# Patient Record
Sex: Female | Born: 1975 | Race: Black or African American | Hispanic: No | Marital: Single | Smoking: Former smoker
Health system: Southern US, Community
[De-identification: ages and names within clinical notes are randomized; demographics above are authoritative.]

## PROBLEM LIST (undated history)

## (undated) DIAGNOSIS — Z789 Other specified health status: Secondary | ICD-10-CM

## (undated) DIAGNOSIS — K219 Gastro-esophageal reflux disease without esophagitis: Secondary | ICD-10-CM

## (undated) DIAGNOSIS — F419 Anxiety disorder, unspecified: Secondary | ICD-10-CM

## (undated) DIAGNOSIS — G4730 Sleep apnea, unspecified: Secondary | ICD-10-CM

## (undated) DIAGNOSIS — J45909 Unspecified asthma, uncomplicated: Secondary | ICD-10-CM

## (undated) DIAGNOSIS — D259 Leiomyoma of uterus, unspecified: Secondary | ICD-10-CM

## (undated) HISTORY — DX: Leiomyoma of uterus, unspecified: D25.9

## (undated) HISTORY — PX: NO PAST SURGERIES: SHX2092

## (undated) HISTORY — PX: WISDOM TOOTH EXTRACTION: SHX21

---

## 1997-07-27 ENCOUNTER — Ambulatory Visit (HOSPITAL_COMMUNITY): Admission: RE | Admit: 1997-07-27 | Discharge: 1997-07-27 | Payer: Self-pay | Admitting: *Deleted

## 1998-07-21 ENCOUNTER — Emergency Department (HOSPITAL_COMMUNITY): Admission: EM | Admit: 1998-07-21 | Discharge: 1998-07-21 | Payer: Self-pay | Admitting: Emergency Medicine

## 1998-07-25 ENCOUNTER — Encounter: Admission: RE | Admit: 1998-07-25 | Discharge: 1998-07-25 | Payer: Self-pay | Admitting: Family Medicine

## 1998-07-31 ENCOUNTER — Encounter: Admission: RE | Admit: 1998-07-31 | Discharge: 1998-07-31 | Payer: Self-pay | Admitting: Family Medicine

## 1998-08-29 ENCOUNTER — Other Ambulatory Visit: Admission: RE | Admit: 1998-08-29 | Discharge: 1998-08-29 | Payer: Self-pay | Admitting: *Deleted

## 1999-09-21 ENCOUNTER — Emergency Department (HOSPITAL_COMMUNITY): Admission: EM | Admit: 1999-09-21 | Discharge: 1999-09-21 | Payer: Self-pay | Admitting: Emergency Medicine

## 2000-01-31 ENCOUNTER — Encounter: Payer: Self-pay | Admitting: Emergency Medicine

## 2000-01-31 ENCOUNTER — Emergency Department (HOSPITAL_COMMUNITY): Admission: EM | Admit: 2000-01-31 | Discharge: 2000-01-31 | Payer: Self-pay | Admitting: Emergency Medicine

## 2000-07-04 ENCOUNTER — Emergency Department (HOSPITAL_COMMUNITY): Admission: EM | Admit: 2000-07-04 | Discharge: 2000-07-05 | Payer: Self-pay | Admitting: Emergency Medicine

## 2001-12-28 ENCOUNTER — Inpatient Hospital Stay (HOSPITAL_COMMUNITY): Admission: AD | Admit: 2001-12-28 | Discharge: 2001-12-28 | Payer: Self-pay | Admitting: *Deleted

## 2004-07-08 ENCOUNTER — Emergency Department (HOSPITAL_COMMUNITY): Admission: EM | Admit: 2004-07-08 | Discharge: 2004-07-08 | Payer: Self-pay | Admitting: Emergency Medicine

## 2005-11-26 ENCOUNTER — Emergency Department (HOSPITAL_COMMUNITY): Admission: EM | Admit: 2005-11-26 | Discharge: 2005-11-26 | Payer: Self-pay | Admitting: Family Medicine

## 2006-11-13 ENCOUNTER — Emergency Department (HOSPITAL_COMMUNITY): Admission: EM | Admit: 2006-11-13 | Discharge: 2006-11-13 | Payer: Self-pay | Admitting: Family Medicine

## 2010-03-08 ENCOUNTER — Encounter: Admission: RE | Admit: 2010-03-08 | Discharge: 2010-03-08 | Payer: Self-pay | Admitting: Obstetrics

## 2010-06-17 ENCOUNTER — Encounter: Payer: Self-pay | Admitting: Gastroenterology

## 2011-02-09 ENCOUNTER — Inpatient Hospital Stay (INDEPENDENT_AMBULATORY_CARE_PROVIDER_SITE_OTHER)
Admission: RE | Admit: 2011-02-09 | Discharge: 2011-02-09 | Disposition: A | Discharge status: Home / Self Care | Payer: BC Managed Care – PPO | Source: Ambulatory Visit | Attending: Family Medicine | Admitting: Family Medicine

## 2011-02-09 DIAGNOSIS — J40 Bronchitis, not specified as acute or chronic: Secondary | ICD-10-CM

## 2011-02-09 DIAGNOSIS — J069 Acute upper respiratory infection, unspecified: Secondary | ICD-10-CM

## 2011-03-13 LAB — POCT RAPID STREP A: Streptococcus, Group A Screen (Direct): NEGATIVE

## 2011-09-28 ENCOUNTER — Emergency Department (HOSPITAL_COMMUNITY)
Admission: EM | Admit: 2011-09-28 | Discharge: 2011-09-28 | Disposition: A | Discharge status: Home / Self Care | Payer: Self-pay | Source: Home / Self Care

## 2011-12-10 ENCOUNTER — Encounter (HOSPITAL_BASED_OUTPATIENT_CLINIC_OR_DEPARTMENT_OTHER): Payer: BC Managed Care – PPO

## 2012-01-17 ENCOUNTER — Telehealth (INDEPENDENT_AMBULATORY_CARE_PROVIDER_SITE_OTHER): Payer: Self-pay

## 2012-01-17 NOTE — Telephone Encounter (Signed)
Dr. Jean Rosenthal Moore's office unable to reach pt to schedule mgm.  Pt has appt w/ Dr. Donell Beers on 01/20/12.

## 2012-01-20 ENCOUNTER — Ambulatory Visit (INDEPENDENT_AMBULATORY_CARE_PROVIDER_SITE_OTHER): Payer: Managed Care, Other (non HMO) | Admitting: General Surgery

## 2012-01-29 ENCOUNTER — Encounter (INDEPENDENT_AMBULATORY_CARE_PROVIDER_SITE_OTHER): Payer: Self-pay | Admitting: General Surgery

## 2012-03-20 ENCOUNTER — Other Ambulatory Visit: Payer: Self-pay | Admitting: Obstetrics & Gynecology

## 2012-03-20 DIAGNOSIS — N6320 Unspecified lump in the left breast, unspecified quadrant: Secondary | ICD-10-CM

## 2012-03-25 ENCOUNTER — Ambulatory Visit
Admission: RE | Admit: 2012-03-25 | Discharge: 2012-03-25 | Disposition: A | Discharge status: Home / Self Care | Payer: Managed Care, Other (non HMO) | Source: Ambulatory Visit | Attending: Obstetrics & Gynecology | Admitting: Obstetrics & Gynecology

## 2012-03-25 DIAGNOSIS — N6320 Unspecified lump in the left breast, unspecified quadrant: Secondary | ICD-10-CM

## 2012-10-12 ENCOUNTER — Encounter (HOSPITAL_COMMUNITY): Payer: Self-pay | Admitting: *Deleted

## 2012-10-12 ENCOUNTER — Inpatient Hospital Stay (HOSPITAL_COMMUNITY)
Admission: AD | Admit: 2012-10-12 | Discharge: 2012-10-12 | Disposition: A | Discharge status: Home / Self Care | Payer: Managed Care, Other (non HMO) | Source: Ambulatory Visit | Attending: Obstetrics | Admitting: Obstetrics

## 2012-10-12 ENCOUNTER — Inpatient Hospital Stay (HOSPITAL_COMMUNITY): Payer: Managed Care, Other (non HMO)

## 2012-10-12 DIAGNOSIS — D259 Leiomyoma of uterus, unspecified: Secondary | ICD-10-CM

## 2012-10-12 DIAGNOSIS — N949 Unspecified condition associated with female genital organs and menstrual cycle: Secondary | ICD-10-CM | POA: Insufficient documentation

## 2012-10-12 DIAGNOSIS — N938 Other specified abnormal uterine and vaginal bleeding: Secondary | ICD-10-CM | POA: Insufficient documentation

## 2012-10-12 DIAGNOSIS — N921 Excessive and frequent menstruation with irregular cycle: Secondary | ICD-10-CM | POA: Insufficient documentation

## 2012-10-12 LAB — COMPREHENSIVE METABOLIC PANEL
AST: 16 U/L (ref 0–37)
BUN: 7 mg/dL (ref 6–23)
CO2: 25 mEq/L (ref 19–32)
Chloride: 101 mEq/L (ref 96–112)
Creatinine, Ser: 0.86 mg/dL (ref 0.50–1.10)
GFR calc Af Amer: >90 mL/min (ref >90)
GFR calc non Af Amer: 85 mL/min — ABNORMAL LOW (ref >90)
Glucose, Bld: 92 mg/dL (ref 70–99)
Total Bilirubin: 0.3 mg/dL (ref 0.3–1.2)

## 2012-10-12 LAB — CBC
HCT: 40.2 % (ref 36.0–46.0)
Hemoglobin: 14.1 g/dL (ref 12.0–15.0)
MCV: 95 fL (ref 78.0–100.0)
Platelets: 207 10*3/uL (ref 150–400)
RBC: 4.23 MIL/uL (ref 3.87–5.11)
WBC: 6.2 10*3/uL (ref 4.0–10.5)

## 2012-10-12 LAB — WET PREP, GENITAL: Yeast Wet Prep HPF POC: NONE SEEN

## 2012-10-12 MED ORDER — NORETHINDRONE-ETH ESTRADIOL 1-35 MG-MCG PO TABS: 1.0000 | ORAL_TABLET | Freq: Three times a day (TID) | ORAL | Status: DC

## 2012-10-12 NOTE — MAU Note (Signed)
Pt was sent to MAU from office.

## 2012-10-12 NOTE — MAU Note (Signed)
Started spotting on the 11th, last night flow became heavier, and today the flow was heavier with large clots.  Changing pads q1h since 1100 this morning.  Dr Clearance Coots sent her here to get blood drawn. Has nuva ring in place.

## 2012-10-12 NOTE — MAU Provider Note (Signed)
History     CSN: [id number]  Arrival date and time: 10/12/12 1438   None     Chief Complaint  Patient presents with  . Vaginal Bleeding   HPI  Kelly Bolton is a 37 y.o. [id number] who was sent from the office for vaginal bleeding. She states that she started spotting early last week, and last night it became very heavy. She states that she is changing a pad about once an hour. She did not have a pelvic exam done in the office. Pt requesting to have nuva ring removed during exam.   Past Medical History  Diagnosis Date  . Preterm delivery     Past Surgical History  Procedure Laterality Date  . Wisdom tooth extraction      History reviewed. No pertinent family history.  History  Substance Use Topics  . Smoking status: Former Smoker    Quit date: 10/12/2008  . Smokeless tobacco: Not on file  . Alcohol Use: No    Allergies: No Known Allergies  Prescriptions prior to admission  Medication Sig Dispense Refill  . amoxicillin (AMOXIL) 500 MG capsule Take 500 mg by mouth 3 (three) times daily.      . cetirizine (ZYRTEC) 10 MG tablet Take 10 mg by mouth daily as needed for allergies.      Marland Kitchen etonogestrel-ethinyl estradiol (NUVARING) 0.12-0.015 MG/24HR vaginal ring Place 1 each vaginally every 28 (twenty-eight) days. Insert vaginally and leave in place for 3 consecutive weeks, then remove for 1 week.      Marland Kitchen HYDROcodone-acetaminophen (NORCO) 7.5-325 MG per tablet Take 1 tablet by mouth every 4 (four) hours as needed for pain.        Review of Systems  Constitutional: Negative for fever.  Eyes: Negative for blurred vision.  Respiratory: Negative for shortness of breath.   Cardiovascular: Negative for chest pain.  Gastrointestinal: Positive for abdominal pain (cramps when she passes a large clot). Negative for nausea, vomiting and constipation.  Genitourinary: Negative for dysuria, urgency and frequency.  Musculoskeletal: Negative for myalgias.  Neurological: Negative for  dizziness and headaches.   Physical Exam   Blood pressure 134/78, pulse 75, resp. rate 16, height 5\' 8"  (1.727 m), weight 86.183 kg (190 lb), last menstrual period 10/04/2012, SpO2 100.00%.  Physical Exam  Nursing note and vitals reviewed. Constitutional: She is oriented to person, place, and time. She appears well-developed and well-nourished. No distress.  Cardiovascular: Normal rate.   Respiratory: Effort normal.  GI: Soft. There is no tenderness.  Genitourinary:   External: no lesion, bloody. Pad that she had been wearing for about 30 mins is soaked Vagina: large amount of blood and blood clots. Nuva ring removed per patient request Cervix: pink, smooth Uterus: NSSC Adnexa: NT  Neurological: She is alert and oriented to person, place, and time.  Skin: Skin is warm and dry.  Psychiatric: She has a normal mood and affect.    MAU Course  Procedures  Results for orders placed during the hospital encounter of 10/12/12 (from the past 24 hour(s))  CBC     Status: None   Collection Time    10/12/12  3:48 PM      Result Value Range   WBC 6.2  4.0 - 10.5 K/uL   RBC 4.23  3.87 - 5.11 MIL/uL   Hemoglobin 14.1  12.0 - 15.0 g/dL   HCT 14.7  82.9 - 56.2 %   MCV 95.0  78.0 - 100.0 fL   MCH 33.3  26.0 -  34.0 pg   MCHC 35.1  30.0 - 36.0 g/dL   RDW 21.3  08.6 - 57.8 %   Platelets 207  150 - 400 K/uL  COMPREHENSIVE METABOLIC PANEL     Status: Abnormal   Collection Time    10/12/12  3:48 PM      Result Value Range   Sodium 135  135 - 145 mEq/L   Potassium 3.5  3.5 - 5.1 mEq/L   Chloride 101  96 - 112 mEq/L   CO2 25  19 - 32 mEq/L   Glucose, Bld 92  70 - 99 mg/dL   BUN 7  6 - 23 mg/dL   Creatinine, Ser 4.69  0.50 - 1.10 mg/dL   Calcium 9.1  8.4 - 62.9 mg/dL   Total Protein 8.1  6.0 - 8.3 g/dL   Albumin 3.5  3.5 - 5.2 g/dL   AST 16  0 - 37 U/L   ALT 15  0 - 35 U/L   Alkaline Phosphatase 46  39 - 117 U/L   Total Bilirubin 0.3  0.3 - 1.2 mg/dL   GFR calc non Af Amer 85 (*) >90  mL/min   GFR calc Af Amer >90  >90 mL/min  HCG, QUANTITATIVE, PREGNANCY     Status: None   Collection Time    10/12/12  3:48 PM      Result Value Range   hCG, Beta Chain, Quant, S <1  <5 mIU/mL    RADIOLOGY REPORT*  Clinical Data: Menorrhagia  TRANSABDOMINAL AND TRANSVAGINAL ULTRASOUND OF PELVIS  Technique: Both transabdominal and transvaginal ultrasound  examinations of the pelvis were performed. Transabdominal  technique was performed for global imaging of the pelvis including  uterus, ovaries, adnexal regions, and pelvic cul-de-sac.  It was necessary to proceed with endovaginal exam following the  transabdominal exam to visualize the endometrium and ovaries.  Comparison: None.  Findings:  Uterus: 10.9 x 7.6 x 7.3 cm. Dominant left lateral uterine fundal  intramural / submucosal fibroid measures 4.9 x 4.7 x 4.4 cm,  displacing the endometrial stripe to the right.  Posterior intramural / subserosal uterine body fibroid measures 2.9  x 2.5 x 1.5 cm.  Endometrium: 8 mm. Uniformly echogenic, displaced to the right by  dominant uterine body fibroid.  Right ovary: 2.5 x 1.3 x 1.0 cm. Normal.  Left ovary: 1.9 x 1.6 x 0.9 cm. Normal.  Other Findings: No free fluid  IMPRESSION:  Uterine fibroids as above. One produces mass effect upon the  endometrial stripe and demonstrates a submucosal component that  could account for the history of menorrhagia.  Original Report Authenticated By: Christiana Pellant, M.D.  1630: C/W Dr. Clearance Coots, plan to have a pelvic ultrasound done. The do an OCP taper with a monophasic (ortho novum 135). Take 1 tab TID until the pack is done and then start a new pack. FU in the office in about 2 months. Will call Dr. Clearance Coots is anything abnormal on [place].  Assessment and Plan   1. Metrorrhagia   2. Fibroid, uterine    RX: ortho novum 1/35 1 TID x 7 DAYS the resume normal use of the pills FU in the office in 2 months unless bleeding increases.  Tawnya Crook 10/12/2012, 4:51 PM

## 2012-10-12 NOTE — MAU Note (Signed)
Pt states began spotting last week, then progressively became heavier. Has changed pad x4 since 11 am. Saw blood clot that filled up her pad. No pain at present. Only painful when passing clots

## 2012-10-13 LAB — GC/CHLAMYDIA PROBE AMP: CT Probe RNA: NEGATIVE

## 2013-02-03 ENCOUNTER — Other Ambulatory Visit: Payer: Self-pay

## 2013-04-06 ENCOUNTER — Encounter: Payer: Self-pay | Admitting: Obstetrics & Gynecology

## 2013-04-12 ENCOUNTER — Other Ambulatory Visit: Payer: Self-pay | Admitting: Obstetrics and Gynecology

## 2013-04-29 NOTE — Patient Instructions (Signed)
Your procedure is scheduled on: 05/07/2013  Enter through the Main Entrance of [facility] at: 0900AM  Pick up the phone at the desk and dial [phone].  Call this number if you have problems the morning of surgery: [phone].  Remember: Do NOT eat food: AFTER MIDNIGHT 05/06/2013  Do NOT drink clear liquids after: AFTER MIDNIGHT 05/06/2013  Take these medicines the morning of surgery with a SIP OF WATER: NONE  Do NOT wear jewelry (body piercing), make-up, or nail polish. Do NOT wear lotions, powders, or perfumes.  You may wear deoderant. Do NOT shave for 48 hours prior to surgery. Do NOT bring valuables to the hospital. Contacts, dentures, or bridgework may not be worn into surgery.  Have a responsible adult drive you home and stay with you for 24 hours after your procedure

## 2013-04-30 ENCOUNTER — Encounter (HOSPITAL_COMMUNITY): Payer: Self-pay | Admitting: Pharmacist

## 2013-04-30 ENCOUNTER — Inpatient Hospital Stay (HOSPITAL_COMMUNITY)
Admission: RE | Admit: 2013-04-30 | Discharge: 2013-04-30 | Disposition: A | Discharge status: Home / Self Care | Payer: Managed Care, Other (non HMO) | Source: Ambulatory Visit

## 2013-05-06 ENCOUNTER — Encounter (HOSPITAL_COMMUNITY): Payer: Self-pay

## 2013-05-06 ENCOUNTER — Encounter (HOSPITAL_COMMUNITY): Payer: Self-pay | Admitting: Obstetrics and Gynecology

## 2013-05-06 ENCOUNTER — Encounter (HOSPITAL_COMMUNITY)
Admission: RE | Admit: 2013-05-06 | Discharge: 2013-05-06 | Disposition: A | Discharge status: Home / Self Care | Payer: Managed Care, Other (non HMO) | Source: Ambulatory Visit | Attending: Obstetrics and Gynecology | Admitting: Obstetrics and Gynecology

## 2013-05-06 DIAGNOSIS — R768 Other specified abnormal immunological findings in serum: Secondary | ICD-10-CM | POA: Diagnosis not present

## 2013-05-06 DIAGNOSIS — N920 Excessive and frequent menstruation with regular cycle: Secondary | ICD-10-CM | POA: Diagnosis present

## 2013-05-06 DIAGNOSIS — N946 Dysmenorrhea, unspecified: Secondary | ICD-10-CM | POA: Diagnosis present

## 2013-05-06 DIAGNOSIS — Z01812 Encounter for preprocedural laboratory examination: Secondary | ICD-10-CM | POA: Insufficient documentation

## 2013-05-06 DIAGNOSIS — D259 Leiomyoma of uterus, unspecified: Secondary | ICD-10-CM | POA: Diagnosis present

## 2013-05-06 DIAGNOSIS — Z01818 Encounter for other preprocedural examination: Secondary | ICD-10-CM | POA: Insufficient documentation

## 2013-05-06 HISTORY — DX: Other specified health status: Z78.9

## 2013-05-06 NOTE — H&P (Signed)
Kelly Bolton is an 37 y.o. female. The patient presents for management of menorrhagia in the presence of known uterine fibroids. The patient gives a history of progressively heavy menses over the last year, lasting up to 7 days. She also gives a history of increasingly severe dysmenorrhea. She has a history of associated PMS symptoms. She underwent a workup of her menorrhagia, which included a sonohysterogram showing intramural, but no intracavitary uterine fibroids, and normal. Endometrial biopsy. The patient had decided that she wanted to undergo endometrial ablation.  She declined progesterone containing contraceptives that. Also had the potential of decreasing her menses, and possibly her dysmenorrhea, such as, Mirena or Depo-Provera or Nexplanon.  She expressed concern about possible side effect of hair loss. Pertinent Gynecological History: Menses: flow is excessive with use of 12-15 pads or tampons on heaviest days, irregular  without intermenstrual spotting and with severe dysmenorrhea Bleeding: History of oligomenorrhea and menorrhagia, with periods becoming progressively head. The over the last year Contraception: none and Patient states she is not interested in pregnancy. 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    Past Medical History  Diagnosis Date  . Preterm delivery   . Medical history non-contributory     Past Surgical History  Procedure Laterality Date  . Wisdom tooth extraction    . No past surgeries      History reviewed. No pertinent family history.  Social History:  reports that she quit smoking about 4 years ago. She does not have any smokeless tobacco history on file. She reports that she does  not drink alcohol or use illicit drugs.  Allergies: No Known Allergies  No prescriptions prior to admission    Review of Systems  Constitutional: Negative.   HENT: Negative.   Eyes: Negative.   Respiratory: Negative.   Cardiovascular: Negative.   Gastrointestinal: Negative.   Genitourinary: Negative.   Musculoskeletal: Negative.   Skin: Negative.   Neurological: Negative.   Endo/Heme/Allergies: Negative.   Psychiatric/Behavioral: The patient is nervous/anxious.     Height 5\' 8"  (1.727 m), weight 200 lb (90.719 kg). Physical Exam  Constitutional: She is oriented to person, place, and time. She appears well-developed and well-nourished.  HENT:  Head: Normocephalic and atraumatic.  Eyes: Conjunctivae and EOM are normal.  Neck: Normal range of motion. Neck supple.  Cardiovascular: Normal rate and regular rhythm.   Respiratory: Effort normal and breath sounds normal.  GI: Soft. Bowel sounds are normal.  Genitourinary: Vagina normal.  Musculoskeletal: Normal range of motion.  Neurological: She is alert and oriented to person, place, and time.  Skin: Skin is warm and dry.  Psychiatric: She has a normal mood and affect.  Pelvic exam: VULVA: normal appearing vulva with no masses, tenderness or lesions, VAGINA: normal appearing vagina with normal color and discharge, no lesions, CERVIX: normal appearing cervix without discharge or lesions, UTERUS: enlarged to 8 week's size, irregular, mobile, ADNEXA: normal adnexa in size, nontender and no masses.  No results found for this or any previous visit (from the past 24 hour(s)).  No results found.  Assessment Menorrhagia , poorly responsive to birth control pills and NuvaRing Dysmenorrhea Uterine fibroids History of oligomenorrhea Needs contraception  Plan:  Long discussion with pt re options for management of  menorrhagia in the presence of fibroids. Pt also wants no pregnancy, but is not using birth control. Reinterated that  effective BC was needed afar endometrial ablation, and that a 85% rate of improved menses with about half those women experiencing amenorrhea. No promises could be made about the effects on cramps or PMS sx. Pt expressed disappointment that real guarantee could be made wrt her accompanying sx and wants to consider whether she wishes to proceed with endometriaL ablation.Risks and benefits of the procedure were reviewed.     HAYGOOD,VANESSA P 05/06/2013, 2:46 PM

## 2013-05-06 NOTE — Pre-Procedure Instructions (Signed)
Pt checked in with Radiology/Lab instead of main lobby on arrival for PAT. Remained there for over an hour. Pre-op interview completed, but patient stated to [facility] (lab phlebotomist) that she could not stay for lab draw and would come back later.

## 2013-05-06 NOTE — Patient Instructions (Signed)
19 Kelly Bolton  05/06/2013   Your procedure is scheduled on:  05/07/13  Enter through the Main Entrance of [facility] at 9 AM.  Pick up the phone at the desk and dial [phone].   Call this number if you have problems the morning of surgery: [phone]   Remember:   Do not eat food:After Midnight.  Do not drink clear liquids: After Midnight.  Take these medicines the morning of surgery with A SIP OF WATER: NA   Do not wear jewelry, make-up or nail polish.  Do not wear lotions, powders, or perfumes. You may wear deodorant.  Do not shave 48 hours prior to surgery.  Do not bring valuables to the hospital.  [facility] is not   responsible for any belongings or valuables brought to the hospital.  Contacts, dentures or bridgework may not be worn into surgery.  Leave suitcase in the car. After surgery it may be brought to your room.  For patients admitted to the hospital, checkout time is 11:00 AM the day of              discharge.   Patients discharged the day of surgery will not be allowed to drive             home.  Name and phone number of your driver: Adaline Sill  [phone]  Special Instructions:   Shower using CHG 2 nights before surgery and the night before surgery.  If you shower the day of surgery use CHG.  Use special wash - you have one bottle of CHG for all showers.  You should use approximately 1/3 of the bottle for each shower.   Please read over the following fact sheets that you were given:   Surgical Site Infection Prevention

## 2013-05-07 ENCOUNTER — Ambulatory Visit (HOSPITAL_COMMUNITY)
Admission: RE | Admit: 2013-05-07 | Payer: Managed Care, Other (non HMO) | Source: Ambulatory Visit | Admitting: Obstetrics and Gynecology

## 2013-05-07 ENCOUNTER — Encounter (HOSPITAL_COMMUNITY): Admission: RE | Payer: Self-pay | Source: Ambulatory Visit

## 2013-05-07 SURGERY — NOVASURE ABLATION
Anesthesia: Choice

## 2014-02-03 ENCOUNTER — Encounter (HOSPITAL_BASED_OUTPATIENT_CLINIC_OR_DEPARTMENT_OTHER): Payer: Self-pay | Admitting: Emergency Medicine

## 2014-02-03 ENCOUNTER — Emergency Department (HOSPITAL_BASED_OUTPATIENT_CLINIC_OR_DEPARTMENT_OTHER)
Admission: EM | Admit: 2014-02-03 | Discharge: 2014-02-03 | Disposition: A | Discharge status: Home / Self Care | Payer: BC Managed Care – PPO | Attending: Emergency Medicine | Admitting: Emergency Medicine

## 2014-02-03 DIAGNOSIS — M545 Low back pain, unspecified: Secondary | ICD-10-CM | POA: Diagnosis not present

## 2014-02-03 DIAGNOSIS — G8929 Other chronic pain: Secondary | ICD-10-CM | POA: Diagnosis not present

## 2014-02-03 DIAGNOSIS — Z3202 Encounter for pregnancy test, result negative: Secondary | ICD-10-CM | POA: Diagnosis not present

## 2014-02-03 DIAGNOSIS — Z87891 Personal history of nicotine dependence: Secondary | ICD-10-CM | POA: Insufficient documentation

## 2014-02-03 DIAGNOSIS — M5441 Lumbago with sciatica, right side: Secondary | ICD-10-CM

## 2014-02-03 LAB — URINALYSIS, ROUTINE W REFLEX MICROSCOPIC
BILIRUBIN URINE: NEGATIVE
GLUCOSE, UA: NEGATIVE mg/dL
Hgb urine dipstick: NEGATIVE
KETONES UR: NEGATIVE mg/dL
LEUKOCYTES UA: NEGATIVE
Nitrite: NEGATIVE
PH: 6.5 (ref 5.0–8.0)
PROTEIN: NEGATIVE mg/dL
Specific Gravity, Urine: 1.026 (ref 1.005–1.030)
Urobilinogen, UA: 1 mg/dL (ref 0.0–1.0)

## 2014-02-03 LAB — PREGNANCY, URINE: Preg Test, Ur: NEGATIVE

## 2014-02-03 MED ORDER — HYDROCODONE-ACETAMINOPHEN 5-325 MG PO TABS: 1.0000 | ORAL_TABLET | Freq: Four times a day (QID) | ORAL | Status: DC | PRN

## 2014-02-03 MED ORDER — ONDANSETRON HCL 4 MG/2ML IJ SOLN: 4.0000 mg | Freq: Once | INTRAMUSCULAR | Status: DC

## 2014-02-03 MED ORDER — MORPHINE SULFATE 4 MG/ML IJ SOLN: 4.0000 mg | Freq: Once | INTRAMUSCULAR | Status: DC

## 2014-02-03 MED ORDER — IBUPROFEN 800 MG PO TABS: 800.0000 mg | ORAL_TABLET | Freq: Three times a day (TID) | ORAL | Status: DC

## 2014-02-03 NOTE — ED Provider Notes (Signed)
CSN: [id number]     Arrival date & time 02/03/14  1806 History   First MD Initiated Contact with Patient 02/03/14 1900     Chief Complaint  Patient presents with  . Back Pain     (Consider location/radiation/quality/duration/timing/severity/associated sxs/prior Treatment) HPI Patient is a 38 year old female with past medical history of chronic back pain who presents to the ED with 2 weeks of back pain. Patient states her back pain has not been present for "years" and after doing cross fit approximately 2 weeks ago she noted a gradual onset of back pain the next morning. Patient states her pain is in her right lower back, radiates to the back of her leg and down to her ankle. Patient states the pain is made worse with movement, walking, sitting, and is relieved by lying right lateral recumbent. Patient states she has not taken anything for her pain. Patient denies any associated weakness, fever, nausea, vomiting, saddle anesthesia, bowel/bladder incontinence/retention.. Patient also denies history of cancer, IV drug use, or direct trauma to the area.  Past Medical History  Diagnosis Date  . Preterm delivery   . Medical history non-contributory    Past Surgical History  Procedure Laterality Date  . Wisdom tooth extraction    . No past surgeries     No family history on file. History  Substance Use Topics  . Smoking status: Former Smoker    Quit date: 10/12/2008  . Smokeless tobacco: Not on file  . Alcohol Use: No   OB History   Grav Para Term Preterm Abortions TAB SAB Ect Mult Living   2 1  1 1 1    1      Review of Systems  Constitutional: Negative for fever, chills and fatigue.  HENT: Negative for trouble swallowing.   Eyes: Negative for visual disturbance.  Respiratory: Negative for shortness of breath.   Cardiovascular: Negative for chest pain.  Gastrointestinal: Negative for nausea, vomiting and abdominal pain.  Genitourinary: Negative for dysuria.  Musculoskeletal:  Positive for back pain. Negative for neck pain and neck stiffness.  Skin: Negative for rash.  Neurological: Negative for dizziness, syncope, speech difficulty, weakness, numbness and headaches.  Psychiatric/Behavioral: Negative.       Allergies  Review of patient's allergies indicates no known allergies.  Home Medications   Prior to Admission medications   Medication Sig Start Date End Date Taking? Authorizing Provider  HYDROcodone-acetaminophen (NORCO/VICODIN) 5-325 MG per tablet Take 1-2 tablets by mouth every 6 (six) hours as needed. 02/03/14   Carrie Mew, PA-C  ibuprofen (ADVIL,MOTRIN) 800 MG tablet Take 1 tablet (800 mg total) by mouth 3 (three) times daily. 02/03/14   Carrie Mew, PA-C   BP 112/62  Pulse 88  Temp(Src) 98.2 F (36.8 C) (Oral)  Resp 18  Ht 5\' 10"  (1.778 m)  Wt 194 lb (87.998 kg)  BMI 27.84 kg/m2  SpO2 100%  LMP 01/27/2014 Physical Exam  Nursing note and vitals reviewed. Constitutional: She is oriented to person, place, and time. She appears well-developed and well-nourished. No distress.  HENT:  Head: Normocephalic and atraumatic.  Mouth/Throat: Oropharynx is clear and moist. No oropharyngeal exudate.  Eyes: EOM are normal. Pupils are equal, round, and reactive to light. Right eye exhibits no discharge. Left eye exhibits no discharge. No scleral icterus.  Neck: Normal range of motion.  Cardiovascular: Normal rate, regular rhythm and normal heart sounds.   No murmur heard. Pulmonary/Chest: Effort normal and breath sounds normal. No respiratory distress.  Abdominal:  Soft. There is no tenderness.  Musculoskeletal: Normal range of motion. She exhibits no edema and no tenderness.  Mild tenderness noted to palpation of patient's right SI joint. Also mild tenderness noted to the L4-S1 region.  Neurological: She is alert and oriented to person, place, and time. She has normal strength. No cranial nerve deficit or sensory deficit. She displays a negative  Romberg sign. Coordination and gait normal. GCS eye subscore is 4. GCS verbal subscore is 5. GCS motor subscore is 6.  Reflex Scores:      Patellar reflexes are 2+ on the right side and 2+ on the left side.      Achilles reflexes are 2+ on the right side and 2+ on the left side. Positive straight leg raise on the right. Negative straight leg raise on the left. Motor strength 5 out of 5 in all major muscle groups of lower extremities bilaterally.  Skin: Skin is warm and dry. No rash noted. She is not diaphoretic.  Psychiatric: She has a normal mood and affect.    ED Course  Procedures (including critical care time) Labs Review Labs Reviewed  URINALYSIS, ROUTINE W REFLEX MICROSCOPIC  PREGNANCY, URINE    Imaging Review No results found.   EKG Interpretation None      MDM   Final diagnoses:  Right-sided low back pain with right-sided sciatica     Gradual onset lower back pain x2 weeks. Patient noticed pain after waking up the next day after doing cross fit. Pain is alleviated by lying in the fetal position on her right side. Pain made worse with walking, sitting. Pain lower right lumbar region with radiation down the back of her right leg to her right ankle. History and exam consistent with lower back pain with radiculopathy. Neuro exam benign no neuro deficits, no history of cancer, IV drug use no fever, no chills, no nausea, vomiting. We will prescribe pain meds and have patient follow up with PCP.  Patient with back pain.  No neurological deficits and normal neuro exam.  Patient can walk but states is painful.  No loss of bowel or bladder control.  No concern for cauda equina.  No fever, night sweats, weight loss, h/o cancer, IVDU.  RICE protocol and pain medicine indicated and discussed with patient. Patient is agreeable to this plan. We encouraged patient to call or return to the ER should her symptoms change, worsen or should she have any questions or concerns.  BP 112/62   Pulse 88  Temp(Src) 98.2 F (36.8 C) (Oral)  Resp 18  Ht 5\' 10"  (1.778 m)  Wt 194 lb (87.998 kg)  BMI 27.84 kg/m2  SpO2 100%  LMP 01/27/2014   Signed,  Dahlia Bailiff, PA-C 12:35 AM    Carrie Mew, PA-C 02/04/14 0036

## 2014-02-03 NOTE — Discharge Instructions (Signed)
Use Motrin 800 mg up to 3 times a day. Use hydrocodone 1-2 pills every 4-6 hours as needed for pain. Followup with primary care physician. Return to the ER if symptoms worsen.  Back Pain:  Your back pain should be treated with medicines such as ibuprofen or aleve and this back pain should get better over the next 2 weeks.  However if you develop severe or worsening pain, low back pain with fever, numbness, weakness or inability to walk or urinate, you should return to the ER immediately.  Please follow up with your doctor this week for a recheck if still having symptoms. Low back pain is discomfort in the lower back that may be due to injuries to muscles and ligaments around the spine.  Occasionally, it may be caused by a a problem to a part of the spine called a disc.  The pain may last several days or a week;  However, most patients get completely well in 4 weeks.  Self - care:  The application of heat can help soothe the pain.  Maintaining your daily activities, including walking, is encourged, as it will help you get better faster than just staying in bed.  Medications are also useful to help with pain control.  A commonly prescribed medications includes acetaminophen.  This medication is generally safe, though you should not take more than 8 of the extra strength (500mg ) pills a day.  Non steroidal anti inflammatory medications including Ibuprofen and naproxen;  These medications help both pain and swelling and are very useful in treating back pain.  They should be taken with food, as they can cause stomach upset, and more seriously, stomach bleeding.    Muscle relaxants:  These medications can help with muscle tightness that is a cause of lower back pain.  Most of these medications can cause drowsiness, and it is not safe to drive or use dangerous machinery while taking them.  You will need to follow up with  Your primary healthcare provider in 1-2 weeks for reassessment.  Be aware that if you  develop new symptoms, such as a fever, leg weakness, difficulty with or loss of control of your urine or bowels, abdominal pain, or more severe pain, you will need to seek medical attention and  / or return to the Emergency department.  If you do not have a doctor see the list below.    Sciatica Sciatica is pain, weakness, numbness, or tingling along the path of the sciatic nerve. The nerve starts in the lower back and runs down the back of each leg. The nerve controls the muscles in the lower leg and in the back of the knee, while also providing sensation to the back of the thigh, lower leg, and the sole of your foot. Sciatica is a symptom of another medical condition. For instance, nerve damage or certain conditions, such as a herniated disk or bone spur on the spine, pinch or put pressure on the sciatic nerve. This causes the pain, weakness, or other sensations normally associated with sciatica. Generally, sciatica only affects one side of the body. CAUSES   Herniated or slipped disc.  Degenerative disk disease.  A pain disorder involving the narrow muscle in the buttocks (piriformis syndrome).  Pelvic injury or fracture.  Pregnancy.  Tumor (rare). SYMPTOMS  Symptoms can vary from mild to very severe. The symptoms usually travel from the low back to the buttocks and down the back of the leg. Symptoms can include:  Mild tingling or  dull aches in the lower back, leg, or hip.  Numbness in the back of the calf or sole of the foot.  Burning sensations in the lower back, leg, or hip.  Sharp pains in the lower back, leg, or hip.  Leg weakness.  Severe back pain inhibiting movement. These symptoms may get worse with coughing, sneezing, laughing, or prolonged sitting or standing. Also, being overweight may worsen symptoms. DIAGNOSIS  Your caregiver will perform a physical exam to look for common symptoms of sciatica. He or she may ask you to do certain movements or activities that  would trigger sciatic nerve pain. Other tests may be performed to find the cause of the sciatica. These may include:  Blood tests.  X-rays.  Imaging tests, such as an MRI or CT scan. TREATMENT  Treatment is directed at the cause of the sciatic pain. Sometimes, treatment is not necessary and the pain and discomfort goes away on its own. If treatment is needed, your caregiver may suggest:  Over-the-counter medicines to relieve pain.  Prescription medicines, such as anti-inflammatory medicine, muscle relaxants, or narcotics.  Applying heat or ice to the painful area.  Steroid injections to lessen pain, irritation, and inflammation around the nerve.  Reducing activity during periods of pain.  Exercising and stretching to strengthen your abdomen and improve flexibility of your spine. Your caregiver may suggest losing weight if the extra weight makes the back pain worse.  Physical therapy.  Surgery to eliminate what is pressing or pinching the nerve, such as a bone spur or part of a herniated disk. HOME CARE INSTRUCTIONS   Only take over-the-counter or prescription medicines for pain or discomfort as directed by your caregiver.  Apply ice to the affected area for 20 minutes, 3-4 times a day for the first 48-72 hours. Then try heat in the same way.  Exercise, stretch, or perform your usual activities if these do not aggravate your pain.  Attend physical therapy sessions as directed by your caregiver.  Keep all follow-up appointments as directed by your caregiver.  Do not wear high heels or shoes that do not provide proper support.  Check your mattress to see if it is too soft. A firm mattress may lessen your pain and discomfort. SEEK IMMEDIATE MEDICAL CARE IF:   You lose control of your bowel or bladder (incontinence).  You have increasing weakness in the lower back, pelvis, buttocks, or legs.  You have redness or swelling of your back.  You have a burning sensation when you  urinate.  You have pain that gets worse when you lie down or awakens you at night.  Your pain is worse than you have experienced in the past.  Your pain is lasting longer than 4 weeks.  You are suddenly losing weight without reason. MAKE SURE YOU:  Understand these instructions.  Will watch your condition.  Will get help right away if you are not doing well or get worse. Document Released: 05/07/2001 Document Revised: 11/12/2011 Document Reviewed: 09/22/2011 [facility] Patient Information 2015 [place], [place]. This information is not intended to replace advice given to you by your health care provider. Make sure you discuss any questions you have with your health care provider.

## 2014-02-03 NOTE — ED Notes (Signed)
All meds on Pt. Were discontinued.

## 2014-02-03 NOTE — ED Notes (Signed)
Chronic back pain. Pain today is worse than normal.

## 2014-02-03 NOTE — ED Notes (Signed)
Pt. Reports she drove to the ED today.

## 2014-02-04 NOTE — ED Provider Notes (Signed)
Medical screening examination/treatment/procedure(s) were performed by non-physician practitioner and as supervising physician I was immediately available for consultation/collaboration.   EKG Interpretation None        Evelina Bucy, MD 02/04/14 2324

## 2014-03-28 ENCOUNTER — Encounter (HOSPITAL_BASED_OUTPATIENT_CLINIC_OR_DEPARTMENT_OTHER): Payer: Self-pay | Admitting: Emergency Medicine

## 2018-03-27 DIAGNOSIS — Z1231 Encounter for screening mammogram for malignant neoplasm of breast: Secondary | ICD-10-CM | POA: Diagnosis not present

## 2018-03-27 DIAGNOSIS — Z1239 Encounter for other screening for malignant neoplasm of breast: Secondary | ICD-10-CM | POA: Diagnosis not present

## 2018-08-24 ENCOUNTER — Other Ambulatory Visit: Payer: Self-pay

## 2018-08-24 ENCOUNTER — Emergency Department (HOSPITAL_BASED_OUTPATIENT_CLINIC_OR_DEPARTMENT_OTHER)
Admission: EM | Admit: 2018-08-24 | Discharge: 2018-08-24 | Disposition: A | Discharge status: Home / Self Care | Payer: BLUE CROSS/BLUE SHIELD | Attending: Emergency Medicine | Admitting: Emergency Medicine

## 2018-08-24 ENCOUNTER — Encounter (HOSPITAL_BASED_OUTPATIENT_CLINIC_OR_DEPARTMENT_OTHER): Payer: Self-pay

## 2018-08-24 DIAGNOSIS — Z87891 Personal history of nicotine dependence: Secondary | ICD-10-CM | POA: Diagnosis not present

## 2018-08-24 DIAGNOSIS — W269XXA Contact with unspecified sharp object(s), initial encounter: Secondary | ICD-10-CM | POA: Diagnosis not present

## 2018-08-24 DIAGNOSIS — S61011A Laceration without foreign body of right thumb without damage to nail, initial encounter: Secondary | ICD-10-CM

## 2018-08-24 DIAGNOSIS — Y999 Unspecified external cause status: Secondary | ICD-10-CM | POA: Insufficient documentation

## 2018-08-24 DIAGNOSIS — Y929 Unspecified place or not applicable: Secondary | ICD-10-CM | POA: Insufficient documentation

## 2018-08-24 DIAGNOSIS — Y93G1 Activity, food preparation and clean up: Secondary | ICD-10-CM | POA: Diagnosis not present

## 2018-08-24 NOTE — ED Notes (Signed)
Lac to rt thumb,  Glass broke while washing dishes,,  Bleeding controlled

## 2018-08-24 NOTE — ED Triage Notes (Signed)
Pt states she cut her right thumb on broken glass 45 min PTA-lac noted-no bleeding-NAD-steady gait

## 2018-08-25 NOTE — ED Provider Notes (Signed)
Emergency Department Provider Note   I have reviewed the triage vital signs and the nursing notes.   HISTORY  Chief Complaint Laceration   HPI Kelly Bolton is a 43 y.o. female who presents with a small laceration to the right thumb while cleaning dishes.  Tetanus is up-to-date.  No injuries elsewhere.  No deficit in the thumb. No other associated or modifying symptoms.    Past Medical History:  Diagnosis Date  . Medical history non-contributory   . Preterm delivery     Patient Active Problem List   Diagnosis Date Noted  . Menorrhagia 05/06/2013  . Fibroid, uterine 05/06/2013  . Dysmenorrhea 05/06/2013  . 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 NO PAST SURGERIES    . WISDOM TOOTH EXTRACTION      Current Outpatient Rx  . Order #: [id number] Class: Print  . Order #: [id number] Class: Print    Allergies Patient has no known allergies.  No family history on file.  Social History Social History   Tobacco Use  . Smoking status: Former Smoker    Last attempt to quit: 10/12/2008    Years since quitting: 9.8  . Smokeless tobacco: Never Used  Substance Use Topics  . Alcohol use: Yes    Comment: occ  . Drug use: No    Review of Systems  All other systems negative except as documented in the HPI. All pertinent positives and negatives as reviewed in the HPI. ____________________________________________   PHYSICAL EXAM:  VITAL SIGNS: ED Triage Vitals  Enc Vitals Group     BP 08/24/18 1229 115/77     Pulse Rate 08/24/18 1229 68     Resp 08/24/18 1229 18     Temp 08/24/18 1229 98 F (36.7 C)     Temp Source 08/24/18 1229 Oral     SpO2 08/24/18 1229 99 %     Weight 08/24/18 1228 200 lb (90.7 kg)     Height 08/24/18 1228 5\' 7"  (1.702 m)     Head Circumference --      Peak Flow --      Pain Score 08/24/18 1227 0     Pain Loc --      Pain Edu? --      Excl. in [place]? --     Constitutional: Alert and  oriented. Well appearing and in no acute distress. Eyes: Conjunctivae are normal. PERRL. EOMI. Head: Atraumatic. Nose: No congestion/rhinnorhea. Mouth/Throat: Mucous membranes are moist.  Oropharynx non-erythematous. Neck: No stridor.  No meningeal signs.   Cardiovascular: Normal rate, regular rhythm. Good peripheral circulation. Grossly normal heart sounds.   Respiratory: Normal respiratory effort.  No retractions. Lungs CTAB. Gastrointestinal: Soft and nontender. No distention.  Musculoskeletal: No lower extremity tenderness nor edema. No gross deformities of extremities. Full extension and strength. Intact sensation to distal thumb. Intact capillary refill. Neurologic:  Normal speech and language. No gross focal neurologic deficits are appreciated.  Skin:  Skin is warm, dry and with 1 cm shallow laceration to dorsal right thumb. No rash noted.   ____________________________________________   PROCEDURES  Procedure(s) performed:   Marland KitchenMarland KitchenLaceration Repair Date/Time: 08/25/2018 7:04 AM Performed by: Merrily Pew, MD Authorized by: Merrily Pew, MD   Consent:    Consent obtained:  Verbal   Consent given by:  Patient   Risks discussed:  Infection, need for additional repair, nerve damage, poor wound healing, poor cosmetic result, pain, retained foreign body, tendon damage and vascular damage  Alternatives discussed:  No treatment, delayed treatment and observation Anesthesia (see MAR for exact dosages):    Anesthesia method:  Local infiltration   Local anesthetic:  Lidocaine 1% w/o epi Laceration details:    Location:  Finger   Finger location:  R thumb   Length (cm):  1   Depth (mm):  2 Repair type:    Repair type:  Simple Pre-procedure details:    Preparation:  Patient was prepped and draped in usual sterile fashion and imaging obtained to evaluate for foreign bodies Exploration:    Wound exploration: wound explored through full range of motion and entire depth of wound probed  and visualized   Treatment:    Area cleansed with:  Saline   Amount of cleaning:  Extensive   Irrigation solution:  Sterile water   Irrigation method:  Syringe Approximation:    Approximation:  Close Post-procedure details:    Patient tolerance of procedure:  Tolerated well, no immediate complications   ____________________________________________   INITIAL IMPRESSION / ASSESSMENT AND PLAN / ED COURSE  Laceration to right thumb. dermaclick applied with good approximation. Splint applied to thumb. tdap already UTD.    Pertinent labs & imaging results that were available during my care of the patient were reviewed by me and considered in my medical decision making (see chart for details).  ____________________________________________  FINAL CLINICAL IMPRESSION(S) / ED DIAGNOSES  Final diagnoses:  Laceration of right thumb without foreign body without damage to nail, initial encounter     MEDICATIONS GIVEN DURING THIS VISIT:  Medications - No data to display   NEW OUTPATIENT MEDICATIONS STARTED DURING THIS VISIT:  Discharge Medication List as of 08/24/2018  1:05 PM      Note:  This note was prepared with assistance of Dragon voice recognition software. Occasional wrong-word or sound-a-like substitutions may have occurred due to the inherent limitations of voice recognition software.   Jaydn Moscato, Corene Cornea, MD 08/25/18 [phone]

## 2018-11-20 DIAGNOSIS — Z20828 Contact with and (suspected) exposure to other viral communicable diseases: Secondary | ICD-10-CM | POA: Diagnosis not present

## 2018-11-25 DIAGNOSIS — Z8616 Personal history of COVID-19: Secondary | ICD-10-CM

## 2018-11-25 HISTORY — DX: Personal history of COVID-19: Z86.16

## 2018-12-03 DIAGNOSIS — B349 Viral infection, unspecified: Secondary | ICD-10-CM | POA: Diagnosis not present

## 2018-12-03 DIAGNOSIS — Z20828 Contact with and (suspected) exposure to other viral communicable diseases: Secondary | ICD-10-CM | POA: Diagnosis not present

## 2018-12-03 DIAGNOSIS — R509 Fever, unspecified: Secondary | ICD-10-CM | POA: Diagnosis not present

## 2018-12-06 ENCOUNTER — Emergency Department (HOSPITAL_BASED_OUTPATIENT_CLINIC_OR_DEPARTMENT_OTHER): Payer: BC Managed Care – PPO

## 2018-12-06 ENCOUNTER — Encounter (HOSPITAL_BASED_OUTPATIENT_CLINIC_OR_DEPARTMENT_OTHER): Payer: Self-pay | Admitting: *Deleted

## 2018-12-06 ENCOUNTER — Other Ambulatory Visit: Payer: Self-pay

## 2018-12-06 ENCOUNTER — Emergency Department (HOSPITAL_BASED_OUTPATIENT_CLINIC_OR_DEPARTMENT_OTHER)
Admission: EM | Admit: 2018-12-06 | Discharge: 2018-12-06 | Disposition: A | Discharge status: Home / Self Care | Payer: BC Managed Care – PPO | Attending: Emergency Medicine | Admitting: Emergency Medicine

## 2018-12-06 DIAGNOSIS — D259 Leiomyoma of uterus, unspecified: Secondary | ICD-10-CM | POA: Diagnosis not present

## 2018-12-06 DIAGNOSIS — D219 Benign neoplasm of connective and other soft tissue, unspecified: Secondary | ICD-10-CM

## 2018-12-06 DIAGNOSIS — Z87891 Personal history of nicotine dependence: Secondary | ICD-10-CM | POA: Insufficient documentation

## 2018-12-06 DIAGNOSIS — R05 Cough: Secondary | ICD-10-CM | POA: Diagnosis not present

## 2018-12-06 DIAGNOSIS — N3289 Other specified disorders of bladder: Secondary | ICD-10-CM | POA: Diagnosis not present

## 2018-12-06 DIAGNOSIS — U071 COVID-19: Secondary | ICD-10-CM | POA: Diagnosis not present

## 2018-12-06 DIAGNOSIS — R509 Fever, unspecified: Secondary | ICD-10-CM | POA: Diagnosis present

## 2018-12-06 DIAGNOSIS — R140 Abdominal distension (gaseous): Secondary | ICD-10-CM | POA: Diagnosis not present

## 2018-12-06 DIAGNOSIS — N838 Other noninflammatory disorders of ovary, fallopian tube and broad ligament: Secondary | ICD-10-CM | POA: Diagnosis not present

## 2018-12-06 LAB — URINALYSIS, ROUTINE W REFLEX MICROSCOPIC
Bilirubin Urine: NEGATIVE
Glucose, UA: NEGATIVE mg/dL
Ketones, ur: NEGATIVE mg/dL
Leukocytes,Ua: NEGATIVE
Nitrite: NEGATIVE
Protein, ur: NEGATIVE mg/dL
Specific Gravity, Urine: <1.005 — ABNORMAL LOW (ref 1.005–1.030)
pH: 7 (ref 5.0–8.0)

## 2018-12-06 LAB — CBC WITH DIFFERENTIAL/PLATELET
Abs Immature Granulocytes: 0 10*3/uL (ref 0.00–0.07)
Basophils Absolute: 0 10*3/uL (ref 0.0–0.1)
Basophils Relative: 0 %
Eosinophils Absolute: 0 10*3/uL (ref 0.0–0.5)
Eosinophils Relative: 0 %
HCT: 39.8 % (ref 36.0–46.0)
Hemoglobin: 13.6 g/dL (ref 12.0–15.0)
Immature Granulocytes: 0 %
Lymphocytes Relative: 32 %
Lymphs Abs: 1.2 10*3/uL (ref 0.7–4.0)
MCH: 33.4 pg (ref 26.0–34.0)
MCHC: 34.2 g/dL (ref 30.0–36.0)
MCV: 97.8 fL (ref 80.0–100.0)
Monocytes Absolute: 0.3 10*3/uL (ref 0.1–1.0)
Monocytes Relative: 7 %
Neutro Abs: 2.2 10*3/uL (ref 1.7–7.7)
Neutrophils Relative %: 61 %
Platelets: 160 10*3/uL (ref 150–400)
RBC: 4.07 MIL/uL (ref 3.87–5.11)
RDW: 12.7 % (ref 11.5–15.5)
WBC: 3.6 10*3/uL — ABNORMAL LOW (ref 4.0–10.5)
nRBC: 0 % (ref 0.0–0.2)

## 2018-12-06 LAB — COMPREHENSIVE METABOLIC PANEL
ALT: 18 U/L (ref 0–44)
AST: 19 U/L (ref 15–41)
Albumin: 3.8 g/dL (ref 3.5–5.0)
Alkaline Phosphatase: 43 U/L (ref 38–126)
Anion gap: 10 (ref 5–15)
BUN: 7 mg/dL (ref 6–20)
CO2: 24 mmol/L (ref 22–32)
Calcium: 8.5 mg/dL — ABNORMAL LOW (ref 8.9–10.3)
Chloride: 100 mmol/L (ref 98–111)
Creatinine, Ser: 0.95 mg/dL (ref 0.44–1.00)
GFR calc Af Amer: >60 mL/min (ref >60)
GFR calc non Af Amer: >60 mL/min (ref >60)
Glucose, Bld: 96 mg/dL (ref 70–99)
Potassium: 3.6 mmol/L (ref 3.5–5.1)
Sodium: 134 mmol/L — ABNORMAL LOW (ref 135–145)
Total Bilirubin: 0.4 mg/dL (ref 0.3–1.2)
Total Protein: 7.7 g/dL (ref 6.5–8.1)

## 2018-12-06 LAB — PREGNANCY, URINE: Preg Test, Ur: NEGATIVE

## 2018-12-06 LAB — URINALYSIS, MICROSCOPIC (REFLEX)

## 2018-12-06 LAB — SARS CORONAVIRUS 2 AG (30 MIN TAT): SARS Coronavirus 2 Ag: POSITIVE — AB

## 2018-12-06 LAB — LIPASE, BLOOD: Lipase: 22 U/L (ref 11–51)

## 2018-12-06 MED ORDER — ONDANSETRON HCL 4 MG/2ML IJ SOLN
4.0000 mg | Freq: Once | INTRAMUSCULAR | Status: DC
  Filled 2018-12-06: qty 2

## 2018-12-06 MED ORDER — SODIUM CHLORIDE 0.9 % IV BOLUS
1000.0000 mL | Freq: Once | INTRAVENOUS | Status: AC
  Administered 2018-12-06: 1000 mL via INTRAVENOUS

## 2018-12-06 MED ORDER — IOHEXOL 300 MG/ML  SOLN
100.0000 mL | Freq: Once | INTRAMUSCULAR | Status: AC | PRN
  Administered 2018-12-06: 100 mL via INTRAVENOUS

## 2018-12-06 NOTE — Progress Notes (Signed)
Labs complete, pregnancy test pending

## 2018-12-06 NOTE — ED Notes (Signed)
Pt refusing to take ibuprofen.  Reports that her last tylenol was 3 hours PTA.

## 2018-12-06 NOTE — ED Provider Notes (Addendum)
[facility] EMERGENCY DEPARTMENT Provider Note   CSN: [id number] Arrival date & time: 12/06/18  1641    History   Chief Complaint Chief Complaint  Patient presents with   Fever    HPI Kelly Bolton is a 43 y.o. female.     The history is provided by the patient.  Fever Temp source:  Subjective Onset quality:  Gradual Duration:  2 days Timing:  Intermittent Progression:  Waxing and waning Chronicity:  New Relieved by:  Acetaminophen Worsened by:  Nothing Associated symptoms: chills, cough, diarrhea, headaches, myalgias and sore throat   Associated symptoms: no chest pain, no dysuria, no ear pain, no rash and no vomiting   Risk factors: sick contacts   Risk factors: no recent sickness     Past Medical History:  Diagnosis Date   Medical history non-contributory    Preterm delivery     Patient Active Problem List   Diagnosis Date Noted   Menorrhagia 05/06/2013   Fibroid, uterine 05/06/2013   Dysmenorrhea 05/06/2013   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 Authorizing Provider  HYDROcodone-acetaminophen (NORCO/VICODIN) 5-325 MG per tablet Take 1-2 tablets by mouth every 6 (six) hours as needed. 02/03/14   Dahlia Bailiff, PA-C  ibuprofen (ADVIL,MOTRIN) 800 MG tablet Take 1 tablet (800 mg total) by mouth 3 (three) times daily. 02/03/14   Dahlia Bailiff, PA-C    Family History History reviewed. No pertinent family history.  Social History Social History   Tobacco Use   Smoking status: Former Smoker    Quit date: 10/12/2008    Years since quitting: 10.1   Smokeless  tobacco: Never Used  Substance Use Topics   Alcohol use: Yes    Comment: occ   Drug use: No     Allergies   Patient has no known allergies.   Review of Systems Review of Systems  Constitutional: Positive for chills and fever.  HENT: Positive for sore throat. Negative for ear pain.   Eyes: Negative for pain and visual disturbance.  Respiratory: Positive for cough. Negative for shortness of breath.   Cardiovascular: Negative for chest pain and palpitations.  Gastrointestinal: Positive for abdominal pain and diarrhea. Negative for vomiting.  Genitourinary: Negative for dysuria and hematuria.  Musculoskeletal: Positive for myalgias. Negative for arthralgias and back pain.  Skin: Negative for color change and rash.  Neurological: Positive for headaches. Negative for seizures and syncope.  All other systems reviewed and are negative.    Physical Exam Updated Vital Signs BP 119/77    Pulse 84    Temp (!) 100.7 F (38.2 C) (Oral)    Resp 16    Ht 5\' 8"  (1.727 m)    Wt 93 kg    LMP 11/26/2018    SpO2 99%    BMI 31.17 kg/m  Physical Exam Vitals signs and nursing note reviewed.  Constitutional:      General: She is not in acute distress.    Appearance: She is well-developed. She is not ill-appearing.  HENT:     Head: Normocephalic and atraumatic.     Nose: Nose normal.     Mouth/Throat:     Mouth: Mucous membranes are moist.  Eyes:     Extraocular Movements: Extraocular movements intact.     Conjunctiva/sclera: Conjunctivae normal.     Pupils: Pupils are equal, round, and reactive to light.  Neck:     Musculoskeletal: Normal range of motion and neck supple.  Cardiovascular:     Rate and Rhythm: Normal rate and regular rhythm.     Pulses: Normal pulses.     Heart sounds: Normal heart sounds. No murmur.  Pulmonary:     Effort: Pulmonary effort is normal. No respiratory distress.     Breath sounds: Normal breath sounds.  Abdominal:     General: There is no distension.       Palpations: Abdomen is soft. There is no mass.     Tenderness: There is abdominal tenderness (llq). There is no guarding.     Hernia: No hernia is present.  Skin:    General: Skin is warm and dry.     Capillary Refill: Capillary refill takes less than 2 seconds.  Neurological:     General: No focal deficit present.     Mental Status: She is alert.  Psychiatric:        Mood and Affect: Mood normal.      ED Treatments / Results  Labs (all labs ordered are listed, but only abnormal results are displayed) Labs Reviewed  SARS CORONAVIRUS 2 (HOSP ORDER, PERFORMED IN  LAB VIA ABBOTT ID) - Abnormal; Notable for the following components:      Result Value   SARS Coronavirus 2 (Abbott ID Now) POSITIVE (*)    All other components within normal limits  CBC WITH DIFFERENTIAL/PLATELET - Abnormal; Notable for the following components:   WBC 3.6 (*)    All other components within normal limits  COMPREHENSIVE METABOLIC PANEL - Abnormal; Notable for the following components:   Sodium 134 (*)    Calcium 8.5 (*)    All other components within normal limits  URINALYSIS, ROUTINE W REFLEX MICROSCOPIC - Abnormal; Notable for the following components:   Specific Gravity, Urine <1.005 (*)    Hgb urine dipstick TRACE (*)    All other components within normal limits  URINALYSIS, MICROSCOPIC (REFLEX) - Abnormal; Notable for the following components:   Bacteria, UA RARE (*)    All other components within normal limits  LIPASE, BLOOD  PREGNANCY, URINE    EKG None  Radiology Ct Abdomen Pelvis W Contrast  Result Date: 12/06/2018 CLINICAL DATA:  Abdominal distension, left lower quadrant pain for 2 days with fever, COVID-19 positive EXAM: CT ABDOMEN AND PELVIS WITH CONTRAST TECHNIQUE: Multidetector CT imaging of the abdomen and pelvis was performed using the standard protocol following bolus administration of intravenous contrast. CONTRAST:  18mL OMNIPAQUE IOHEXOL 300 MG/ML  SOLN  COMPARISON:  Chest x-ray 12/06/2018 FINDINGS: Lower chest: Lung bases demonstrate subsegmental atelectasis at the right middle lobe. There are small pleural effusions. Normal heart size. Hepatobiliary: Subcentimeter hypodensity within the right hepatic lobe, too small to further characterize. Probable focal fat infiltration near the falciform ligament. No gallstones, gallbladder wall thickening, or biliary dilatation. Pancreas: Unremarkable. No pancreatic ductal dilatation or surrounding inflammatory  changes. Spleen: Normal in size without focal abnormality. Adrenals/Urinary Tract: Adrenal glands are normal. Subcentimeter hypodensities left kidney too small to further characterize. The bladder is slightly thick walled Stomach/Bowel: Stomach is within normal limits. Appendix appears normal. No evidence of bowel wall thickening, distention, or inflammatory changes. Vascular/Lymphatic: Nonaneurysmal aorta. No significantly enlarged lymph nodes Reproductive: Uterine enlargement. Dominant heterogenous enhancing mass within the left uterus measuring up to 5.5 cm, consistent with fibroid. Intrauterine device is present, it appears to be somewhat low lying at the lower uterine segment/upper cervical region. Circumscribed 5.3 by 3.8 cm suspected complex cyst along the right anterior aspect of the uterus, likely right adnexal. Other: Negative for free fluid or free air. Musculoskeletal: No acute or significant osseous findings. IMPRESSION: 1. Small bilateral pleural effusions. 2. Slightly thick-walled appearance of the urinary bladder, question mild cystitis 3. Enlarged uterus with dominant 5.5 cm enhancing mass presumably a fibroid. Intrauterine device appears low lying in the lower uterine segment/upper cervix 4. 5.3 cm circumscribed oval hypodensity along the right anterior aspect of the uterus, presumably representing complex cyst in the adnexa, likely right adnexa. Correlation with pelvic ultrasound is recommended.  Electronically Signed   By: Donavan Foil M.D.   On: 12/06/2018 21:08   Dg Chest Portable 1 View  Result Date: 12/06/2018 CLINICAL DATA:  43 year old female with fever of 103. Weakness and cough. Positive for COVID-19 today. EXAM: PORTABLE CHEST 1 VIEW COMPARISON:  Report of chest radiographs 2126 (no images available). FINDINGS: Portable AP upright view at 1912 hours. Lung volumes and mediastinal contours are within normal limits. Visualized tracheal air column is within normal limits. Allowing for portable technique the lungs are clear. No pleural effusion. Negative visible osseous structures. IMPRESSION: Negative portable chest. Electronically Signed   By: Genevie Ann M.D.   On: 12/06/2018 19:37    Procedures Procedures (including critical care time)  Medications Ordered in ED Medications  ondansetron (ZOFRAN) injection 4 mg (4 mg Intravenous Refused 12/06/18 1804)  sodium chloride 0.9 % bolus 1,000 mL (0 mLs Intravenous Stopped 12/06/18 1911)  iohexol (OMNIPAQUE) 300 MG/ML solution 100 mL (100 mLs Intravenous Contrast Given 12/06/18 2055)     Initial Impression / Assessment and Plan / ED Course  I have reviewed the triage vital signs and the nursing notes.  Pertinent labs & imaging results that were available during my care of the patient were reviewed by me and considered in my medical decision making (see chart for details).     Kelly Bolton is a 43 year old female with no significant medical history presents to the ED with fever, body aches, left lower abdominal pain.  Patient with fever upon arrival.  Has known contacts with coronavirus.  Positive for coronavirus here.  Patient with white count of 3.6.  However does not have any respiratory symptoms currently.  Has had some intermittent cough.  No significant anemia, electrolyte abnormality, kidney injury.  Chest x-ray overall unremarkable.  Small bilateral pleural effusions were found on CT of abdomen and pelvis.  Patient with CT scan  that showed uterine fibroid which is likely the cause of her abdominal pain. No vaginal discharge.  She also likely has a complex cyst in the right adnexa.  She does not have any tenderness in this area.  No concern for torsion or infection.  Findings were confirmed the radiology on the phone that this is likely complex cyst.  No concern for TOA.  Patient was made aware of need for outpatient ultrasound to further evaluate  fibroid and right-sided adnexal mass.  Patient was given information about self isolation at home.  She was given return precautions.  Urinalysis was negative.  Discharged from ED in good condition.  Kelly Bolton was evaluated in Emergency Department on 12/06/2018 for the symptoms described in the history of present illness. She was evaluated in the context of the global COVID-19 pandemic, which necessitated consideration that the patient might be at risk for infection with the SARS-CoV-2 virus that causes COVID-19. Institutional protocols and algorithms that pertain to the evaluation of patients at risk for COVID-19 are in a state of rapid change based on information released by regulatory bodies including the CDC and federal and state organizations. These policies and algorithms were followed during the patient's care in the ED.  This chart was dictated using voice recognition software.  Despite best efforts to proofread,  errors can occur which can change the documentation meaning.     Final Clinical Impressions(s) / ED Diagnoses   Final diagnoses:  [id number]  Fibroid    ED Discharge Orders    None       Lennice Sites, DO 12/06/18 2145    Lennice Sites, DO 12/06/18 2156

## 2018-12-06 NOTE — ED Notes (Signed)
ED Provider at bedside. Dr.Curtolo

## 2018-12-06 NOTE — ED Triage Notes (Signed)
Pt reports fever x 5 days. Reports negative covid swab but friends that she has been with tested positive. Reports lower left abdominal pain. tmax 103.

## 2018-12-06 NOTE — Discharge Instructions (Signed)
Follow-up with your OB/GYN for pelvic ultrasound.  You need to follow-up likely uterine fibroid and right-sided ovarian cyst.  However ultrasound will further characterize these and make sure that this is not a mass or cancerous.  Suspicion is that this is likely a cyst.  Please return to the ED if you have any worsening symptoms including worsening respiratory symptoms.

## 2018-12-21 DIAGNOSIS — Z7689 Persons encountering health services in other specified circumstances: Secondary | ICD-10-CM | POA: Diagnosis not present

## 2018-12-21 DIAGNOSIS — U071 COVID-19: Secondary | ICD-10-CM | POA: Diagnosis not present

## 2019-02-18 DIAGNOSIS — M722 Plantar fascial fibromatosis: Secondary | ICD-10-CM | POA: Diagnosis not present

## 2019-03-04 DIAGNOSIS — Z139 Encounter for screening, unspecified: Secondary | ICD-10-CM | POA: Diagnosis not present

## 2019-04-06 ENCOUNTER — Other Ambulatory Visit (HOSPITAL_COMMUNITY)
Admission: RE | Admit: 2019-04-06 | Discharge: 2019-04-06 | Disposition: A | Discharge status: Home / Self Care | Payer: BC Managed Care – PPO | Source: Ambulatory Visit | Attending: Obstetrics and Gynecology | Admitting: Obstetrics and Gynecology

## 2019-04-06 ENCOUNTER — Other Ambulatory Visit: Payer: Self-pay | Admitting: Obstetrics and Gynecology

## 2019-04-06 DIAGNOSIS — Z124 Encounter for screening for malignant neoplasm of cervix: Secondary | ICD-10-CM | POA: Diagnosis not present

## 2019-04-06 DIAGNOSIS — Z30011 Encounter for initial prescription of contraceptive pills: Secondary | ICD-10-CM | POA: Diagnosis not present

## 2019-04-06 DIAGNOSIS — Z30432 Encounter for removal of intrauterine contraceptive device: Secondary | ICD-10-CM | POA: Diagnosis not present

## 2019-04-06 DIAGNOSIS — Z3202 Encounter for pregnancy test, result negative: Secondary | ICD-10-CM | POA: Diagnosis not present

## 2019-04-06 DIAGNOSIS — L659 Nonscarring hair loss, unspecified: Secondary | ICD-10-CM | POA: Diagnosis not present

## 2019-04-09 LAB — CYTOLOGY - PAP
Comment: NEGATIVE
Diagnosis: NEGATIVE
High risk HPV: NEGATIVE

## 2019-05-18 DIAGNOSIS — S2232XA Fracture of one rib, left side, initial encounter for closed fracture: Secondary | ICD-10-CM | POA: Diagnosis not present

## 2019-05-18 DIAGNOSIS — R0781 Pleurodynia: Secondary | ICD-10-CM | POA: Diagnosis not present

## 2019-05-18 DIAGNOSIS — S2341XA Sprain of ribs, initial encounter: Secondary | ICD-10-CM | POA: Diagnosis not present

## 2019-05-18 DIAGNOSIS — S299XXA Unspecified injury of thorax, initial encounter: Secondary | ICD-10-CM | POA: Diagnosis not present

## 2019-06-21 DIAGNOSIS — B009 Herpesviral infection, unspecified: Secondary | ICD-10-CM | POA: Diagnosis not present

## 2019-06-21 DIAGNOSIS — Z3041 Encounter for surveillance of contraceptive pills: Secondary | ICD-10-CM | POA: Diagnosis not present

## 2019-06-21 DIAGNOSIS — D219 Benign neoplasm of connective and other soft tissue, unspecified: Secondary | ICD-10-CM | POA: Diagnosis not present

## 2019-09-03 ENCOUNTER — Other Ambulatory Visit: Payer: Self-pay | Admitting: Family Medicine

## 2019-09-03 ENCOUNTER — Ambulatory Visit
Admission: RE | Admit: 2019-09-03 | Discharge: 2019-09-03 | Disposition: A | Discharge status: Home / Self Care | Payer: BC Managed Care – PPO | Source: Ambulatory Visit | Attending: Family Medicine | Admitting: Family Medicine

## 2019-09-03 DIAGNOSIS — R0600 Dyspnea, unspecified: Secondary | ICD-10-CM | POA: Diagnosis not present

## 2019-09-03 DIAGNOSIS — K219 Gastro-esophageal reflux disease without esophagitis: Secondary | ICD-10-CM | POA: Diagnosis not present

## 2019-09-03 DIAGNOSIS — R053 Chronic cough: Secondary | ICD-10-CM

## 2019-09-03 DIAGNOSIS — R05 Cough: Secondary | ICD-10-CM

## 2019-09-03 DIAGNOSIS — Z8616 Personal history of COVID-19: Secondary | ICD-10-CM | POA: Diagnosis not present

## 2019-10-18 DIAGNOSIS — R609 Edema, unspecified: Secondary | ICD-10-CM | POA: Diagnosis not present

## 2019-10-18 DIAGNOSIS — Z0189 Encounter for other specified special examinations: Secondary | ICD-10-CM | POA: Diagnosis not present

## 2019-10-18 DIAGNOSIS — R600 Localized edema: Secondary | ICD-10-CM | POA: Diagnosis not present

## 2019-10-18 DIAGNOSIS — R2243 Localized swelling, mass and lump, lower limb, bilateral: Secondary | ICD-10-CM | POA: Diagnosis not present

## 2019-10-19 DIAGNOSIS — R600 Localized edema: Secondary | ICD-10-CM | POA: Diagnosis not present

## 2019-10-26 DIAGNOSIS — R600 Localized edema: Secondary | ICD-10-CM | POA: Diagnosis not present

## 2019-10-26 DIAGNOSIS — K2100 Gastro-esophageal reflux disease with esophagitis, without bleeding: Secondary | ICD-10-CM | POA: Diagnosis not present

## 2019-10-27 DIAGNOSIS — R600 Localized edema: Secondary | ICD-10-CM | POA: Diagnosis not present

## 2019-10-28 DIAGNOSIS — R079 Chest pain, unspecified: Secondary | ICD-10-CM | POA: Diagnosis not present

## 2019-10-28 DIAGNOSIS — R600 Localized edema: Secondary | ICD-10-CM | POA: Diagnosis not present

## 2019-11-09 DIAGNOSIS — R600 Localized edema: Secondary | ICD-10-CM | POA: Diagnosis not present

## 2019-11-09 DIAGNOSIS — I83893 Varicose veins of bilateral lower extremities with other complications: Secondary | ICD-10-CM | POA: Diagnosis not present

## 2019-11-10 DIAGNOSIS — R600 Localized edema: Secondary | ICD-10-CM | POA: Diagnosis not present

## 2019-12-01 ENCOUNTER — Encounter: Payer: Self-pay | Admitting: Critical Care Medicine

## 2019-12-01 ENCOUNTER — Ambulatory Visit (INDEPENDENT_AMBULATORY_CARE_PROVIDER_SITE_OTHER): Payer: BC Managed Care – PPO | Admitting: Critical Care Medicine

## 2019-12-01 ENCOUNTER — Other Ambulatory Visit: Payer: Self-pay

## 2019-12-01 VITALS — BP 120/90 | HR 86 | Temp 98.4°F | Ht 68.0 in | Wt 218.4 lb

## 2019-12-01 DIAGNOSIS — Z8616 Personal history of COVID-19: Secondary | ICD-10-CM

## 2019-12-01 DIAGNOSIS — J4540 Moderate persistent asthma, uncomplicated: Secondary | ICD-10-CM | POA: Diagnosis not present

## 2019-12-01 DIAGNOSIS — R05 Cough: Secondary | ICD-10-CM | POA: Diagnosis not present

## 2019-12-01 DIAGNOSIS — R0609 Other forms of dyspnea: Secondary | ICD-10-CM

## 2019-12-01 DIAGNOSIS — R0600 Dyspnea, unspecified: Secondary | ICD-10-CM | POA: Diagnosis not present

## 2019-12-01 DIAGNOSIS — R053 Chronic cough: Secondary | ICD-10-CM

## 2019-12-01 MED ORDER — AZELASTINE HCL 0.1 % NA SOLN: 2.0000 | Freq: Two times a day (BID) | NASAL | 11 refills | Status: AC

## 2019-12-01 MED ORDER — FLUTICASONE-SALMETEROL 250-50 MCG/DOSE IN AEPB: 1.0000 | INHALATION_SPRAY | Freq: Two times a day (BID) | RESPIRATORY_TRACT | 11 refills | Status: AC

## 2019-12-01 MED ORDER — ALBUTEROL SULFATE HFA 108 (90 BASE) MCG/ACT IN AERS: 2.0000 | INHALATION_SPRAY | RESPIRATORY_TRACT | 11 refills | Status: AC | PRN

## 2019-12-01 NOTE — Progress Notes (Signed)
Synopsis: Referred in July 2021 for SOB by Antony Contras, MD.  Subjective:   PATIENT ID: Kelly Bolton GENDER: female DOB: 22-Mar-1976, MRN: [id number]  Chief Complaint  Patient presents with  . Consult    Patient has shortness of breath with exertion. Had Covid in July 2020. Patient states that she has to sleep with her head propped up because if she doesnt she will start coughing and feels like she cant get her breath. Dry/productive cough with clear sputum. Has had problems with legs and feet swelling lately meeting with Cardiology next week    Kelly Bolton is a 44 year old woman who presents for persistent and worsening cough and shortness of breath since Covid in July 2020.  She was seen in the emergency department, but did not require hospital admission.  She has shortness of breath with talking and when walking up the steps.  She complains of a tickle in her throat and a sensation that her airway is closing up.  She has frequent coughing, especially when she is laughing or breathing hard.  She complains of significant mucus and chest congestion, especially when she lies flat.  Her symptoms are improved when she is lying propped up.  She has had allergic rhinitis in the past, but currently denies nasal congestion, sneezing, or other allergic sinus symptoms.  She has frequent throat clearing and occasionally can feel her ears draining.  She has a rescue inhaler, which sometimes improves her symptoms, but not always.  She feels like some of her episodes are similar to having an asthma attack.  She has no previous history of asthma or pulmonary symptoms prior to having Covid.  Her son had Covid as a child, but outgrew it.  He was a premature infant.  He does not have a history of allergies or eczema.  Since having Covid she has had mild heartburn, especially triggered by food.  She is not regularly taking a PPI, and has not had symptoms in over a month since she modified her diet.  She has had foot  and leg edema in bilateral lower extremities for the last month and has an appointment with cardiology and vascular surgery to evaluate this.  She quit smoking 7 years ago after 5 years x <1 pack per week cigarette smoking.  She works as a Freight forwarder at [employer].  She is around dust there, which can worsen her symptoms, but is not around the tobacco processing plant.    Past Medical History:  Diagnosis Date  . History of COVID-19 11/2018  . Preterm delivery   . Uterine fibroid      Family History  Problem Relation Age of Onset  . Cancer Father   . Hypertension Maternal Grandmother   . Diabetes Maternal Grandmother   . Asthma Son        childhood asthma, premature infant  . Lung cancer Half-Sister      Past Surgical History:  Procedure Laterality Date  . WISDOM TOOTH EXTRACTION      Social History   Socioeconomic History  . Marital status: Single    Spouse name: Not on file  . Number of children: Not on file  . Years of education: Not on file  . Highest education level: Not on file  Occupational History  . Not on file  Tobacco Use  . Smoking status: Former Smoker    Packs/day: 0.20    Years: 5.00    Pack years: 1.00  Types: Cigarettes    Quit date: 10/12/2008    Years since quitting: 11.1  . Smokeless tobacco: Never Used  . Tobacco comment: 1 pack would last over a week  Vaping Use  . Vaping Use: Never used  Substance and Sexual Activity  . Alcohol use: Yes    Comment: occ  . Drug use: No  . Sexual activity: Not on file  Other Topics Concern  . Not on file  Social History Narrative  . Not on file   Social Determinants of Health   Financial Resource Strain:   . Difficulty of Paying Living Expenses:   Food Insecurity:   . Worried About Charity fundraiser in the Last Year:   . Arboriculturist in the Last Year:   Transportation Needs:   . Film/video editor (Medical):   Marland Kitchen Lack of Transportation (Non-Medical):   Physical  Activity:   . Days of Exercise per Week:   . Minutes of Exercise per Session:   Stress:   . Feeling of Stress :   Social Connections:   . Frequency of Communication with Friends and Family:   . Frequency of Social Gatherings with Friends and Family:   . Attends Religious Services:   . Active Member of Clubs or Organizations:   . Attends Archivist Meetings:   Marland Kitchen Marital Status:   Intimate Partner Violence:   . Fear of Current or Ex-Partner:   . Emotionally Abused:   Marland Kitchen Physically Abused:   . Sexually Abused:      No Known Allergies    There is no immunization history on file for this patient.  Outpatient Medications Prior to Visit  Medication Sig Dispense Refill  . esomeprazole (NEXIUM) 10 MG packet Take 10 mg by mouth daily before breakfast.    . ibuprofen (ADVIL,MOTRIN) 800 MG tablet Take 1 tablet (800 mg total) by mouth 3 (three) times daily. 21 tablet 0  . SPRINTEC 28 0.25-35 MG-MCG tablet Take 1 tablet by mouth daily.    Marland Kitchen HYDROcodone-acetaminophen (NORCO/VICODIN) 5-325 MG per tablet Take 1-2 tablets by mouth every 6 (six) hours as needed. 20 tablet 0   No facility-administered medications prior to visit.    Review of Systems  Constitutional: Negative for chills, fever and weight loss.  Respiratory: Positive for cough, sputum production, shortness of breath and wheezing. Negative for hemoptysis.   Cardiovascular: Positive for chest pain and leg swelling.  Gastrointestinal: Positive for heartburn. Negative for blood in stool, diarrhea, nausea and vomiting.  Genitourinary: Negative for hematuria.  Neurological: Positive for dizziness.  Endo/Heme/Allergies: Positive for environmental allergies.     Objective:   Vitals:   12/01/19 1357  BP: 120/90  Pulse: 86  Temp: 98.4 F (36.9 C)  TempSrc: Oral  SpO2: 98%  Weight: 218 lb 6.4 oz (99.1 kg)  Height: 5\' 8"  (1.727 m)   98% on  RA BMI Readings from Last 3 Encounters:  12/01/19 33.21 kg/m  12/06/18  31.17 kg/m  08/24/18 31.32 kg/m   Wt Readings from Last 3 Encounters:  12/01/19 218 lb 6.4 oz (99.1 kg)  12/06/18 205 lb (93 kg)  08/24/18 200 lb (90.7 kg)    Physical Exam Vitals reviewed.  Constitutional:      General: She is not in acute distress.    Appearance: Normal appearance. She is not ill-appearing.  HENT:     Head: Normocephalic and atraumatic.  Eyes:     General: No scleral icterus. Cardiovascular:  Rate and Rhythm: Normal rate and regular rhythm.     Heart sounds: No murmur heard.   Pulmonary:     Comments: Breathing comfortably on room air, no conversational dyspnea.  Occasional dry cough, especially with laughing. Diffuse expiratory wheezing bilaterally. Abdominal:     General: There is no distension.  Musculoskeletal:        General: No deformity.     Cervical back: Neck supple.     Comments: Symmetric bilateral lower extremity edema  Lymphadenopathy:     Cervical: No cervical adenopathy.  Skin:    General: Skin is warm and dry.     Findings: No rash.  Neurological:     General: No focal deficit present.     Mental Status: She is alert.     Coordination: Coordination normal.  Psychiatric:        Mood and Affect: Mood normal.        Behavior: Behavior normal.      CBC    Component Value Date/Time   WBC 3.6 (L) 12/06/2018 1754   RBC 4.07 12/06/2018 1754   HGB 13.6 12/06/2018 1754   HCT 39.8 12/06/2018 1754   PLT 160 12/06/2018 1754   MCV 97.8 12/06/2018 1754   MCH 33.4 12/06/2018 1754   MCHC 34.2 12/06/2018 1754   RDW 12.7 12/06/2018 1754   LYMPHSABS 1.2 12/06/2018 1754   MONOABS 0.3 12/06/2018 1754   EOSABS 0.0 12/06/2018 1754   BASOSABS 0.0 12/06/2018 1754    CHEMISTRY No results for input(s): NA, K, CL, CO2, GLUCOSE, BUN, CREATININE, CALCIUM, MG, PHOS in the last 168 hours. CrCl cannot be calculated (Patient's most recent lab result is older than the maximum 21 days allowed.).   Chest Imaging- films reviewed: CXR, 2 view  09/03/2019- normal  Pulmonary Functions Testing Results: No flowsheet data found.      Assessment & Plan:     ICD-10-CM   1. DOE (dyspnea on exertion)  R06.00 Pulmonary function test  2. Moderate persistent asthma without complication  I33.82   3. Chronic cough  R05   4. History of COVID-19  Z86.16    Chronic dyspnea on exertion and cough since COVID-19 infection, likely due to moderate persistent asthma -Start advair 250-50 BID. Rinse mouth after every use. -Albuterol every 4 hours as needed -PFTs -If she is not feeling better in about a week, she can take prednisone that she has.  She is correct in her concerned that it could cause additional weight gain and swelling, so escalating her inhaler regimen would be preferred -Recommend Covid vaccination.  Will need annual flu and pneumonia vaccination. -If PFTs do not confirm asthma or a suspicion for restrictive lung disease, will obtain HRCT chest  RTC in 1 to 2 months after PFTs    Current Outpatient Medications:  .  esomeprazole (NEXIUM) 10 MG packet, Take 10 mg by mouth daily before breakfast., Disp: , Rfl:  .  ibuprofen (ADVIL,MOTRIN) 800 MG tablet, Take 1 tablet (800 mg total) by mouth 3 (three) times daily., Disp: 21 tablet, Rfl: 0 .  SPRINTEC 28 0.25-35 MG-MCG tablet, Take 1 tablet by mouth daily., Disp: , Rfl:  .  albuterol (VENTOLIN HFA) 108 (90 Base) MCG/ACT inhaler, Inhale 2 puffs into the lungs every 4 (four) hours as needed for wheezing or shortness of breath., Disp: 18 g, Rfl: 11 .  azelastine (ASTELIN) 0.1 % nasal spray, Place 2 sprays into both nostrils 2 (two) times daily. Use in each nostril as directed,  Disp: 30 mL, Rfl: 11 .  Fluticasone-Salmeterol (ADVAIR DISKUS) 250-50 MCG/DOSE AEPB, Inhale 1 puff into the lungs 2 (two) times daily., Disp: 60 each, Rfl: Patterson Fox Salminen, DO [facility] 12/01/2019 2:56 PM

## 2019-12-01 NOTE — Patient Instructions (Addendum)
Thank you for visiting Dr. Carlis Abbott at [facility]. We recommend the following: Orders Placed This Encounter  Procedures  . Pulmonary function test   Orders Placed This Encounter  Procedures  . Pulmonary function test    Standing Status:   Future    Standing Expiration Date:   11/30/2020    Order Specific Question:   Where should this test be performed?    Answer:   [facility]    Order Specific Question:   Full PFT: includes the following: basic spirometry, spirometry pre & post bronchodilator, diffusion capacity (DLCO), lung volumes    Answer:   Full PFT    Meds ordered this encounter  Medications  . albuterol (VENTOLIN HFA) 108 (90 Base) MCG/ACT inhaler    Sig: Inhale 2 puffs into the lungs every 4 (four) hours as needed for wheezing or shortness of breath.    Dispense:  18 g    Refill:  11  . Fluticasone-Salmeterol (ADVAIR DISKUS) 250-50 MCG/DOSE AEPB    Sig: Inhale 1 puff into the lungs 2 (two) times daily.    Dispense:  60 each    Refill:  11  . azelastine (ASTELIN) 0.1 % nasal spray    Sig: Place 2 sprays into both nostrils 2 (two) times daily. Use in each nostril as directed    Dispense:  30 mL    Refill:  11    Return in about 2 months (around 02/01/2020).  After PFTs.   Please do your part to reduce the spread of COVID-19.

## 2019-12-29 DIAGNOSIS — R600 Localized edema: Secondary | ICD-10-CM | POA: Diagnosis not present

## 2019-12-29 DIAGNOSIS — I878 Other specified disorders of veins: Secondary | ICD-10-CM | POA: Diagnosis not present

## 2019-12-30 DIAGNOSIS — I498 Other specified cardiac arrhythmias: Secondary | ICD-10-CM | POA: Diagnosis not present

## 2020-01-01 ENCOUNTER — Other Ambulatory Visit (HOSPITAL_COMMUNITY): Payer: BC Managed Care – PPO

## 2020-01-15 ENCOUNTER — Other Ambulatory Visit (HOSPITAL_COMMUNITY)
Admission: RE | Admit: 2020-01-15 | Discharge: 2020-01-15 | Disposition: A | Discharge status: Home / Self Care | Payer: BC Managed Care – PPO | Source: Ambulatory Visit | Attending: Critical Care Medicine | Admitting: Critical Care Medicine

## 2020-01-15 DIAGNOSIS — Z20822 Contact with and (suspected) exposure to covid-19: Secondary | ICD-10-CM | POA: Diagnosis not present

## 2020-01-15 DIAGNOSIS — Z01812 Encounter for preprocedural laboratory examination: Secondary | ICD-10-CM | POA: Insufficient documentation

## 2020-01-15 LAB — SARS CORONAVIRUS 2 (TAT 6-24 HRS): SARS Coronavirus 2: NEGATIVE

## 2020-01-18 DIAGNOSIS — R600 Localized edema: Secondary | ICD-10-CM | POA: Diagnosis not present

## 2020-01-18 DIAGNOSIS — I83893 Varicose veins of bilateral lower extremities with other complications: Secondary | ICD-10-CM | POA: Diagnosis not present

## 2020-01-18 DIAGNOSIS — I8393 Asymptomatic varicose veins of bilateral lower extremities: Secondary | ICD-10-CM | POA: Diagnosis not present

## 2020-01-18 DIAGNOSIS — I872 Venous insufficiency (chronic) (peripheral): Secondary | ICD-10-CM | POA: Diagnosis not present

## 2020-01-19 ENCOUNTER — Ambulatory Visit (INDEPENDENT_AMBULATORY_CARE_PROVIDER_SITE_OTHER): Payer: BC Managed Care – PPO | Admitting: Critical Care Medicine

## 2020-01-19 DIAGNOSIS — M79671 Pain in right foot: Secondary | ICD-10-CM | POA: Diagnosis not present

## 2020-01-19 DIAGNOSIS — M7731 Calcaneal spur, right foot: Secondary | ICD-10-CM | POA: Diagnosis not present

## 2020-01-19 DIAGNOSIS — R0600 Dyspnea, unspecified: Secondary | ICD-10-CM | POA: Diagnosis not present

## 2020-01-19 DIAGNOSIS — R0609 Other forms of dyspnea: Secondary | ICD-10-CM

## 2020-01-19 DIAGNOSIS — M722 Plantar fascial fibromatosis: Secondary | ICD-10-CM | POA: Diagnosis not present

## 2020-01-19 LAB — PULMONARY FUNCTION TEST
DL/VA % pred: 102 %
DL/VA: 4.34 ml/min/mmHg/L
DLCO cor % pred: 93 %
DLCO cor: 22.99 ml/min/mmHg
DLCO unc % pred: 93 %
DLCO unc: 22.99 ml/min/mmHg
FEF 25-75 Post: 3.31 L/sec
FEF 25-75 Pre: 2.13 L/sec
FEF2575-%Change-Post: 55 %
FEF2575-%Pred-Post: 112 %
FEF2575-%Pred-Pre: 72 %
FEV1-%Change-Post: 11 %
FEV1-%Pred-Post: 104 %
FEV1-%Pred-Pre: 93 %
FEV1-Post: 2.92 L
FEV1-Pre: 2.63 L
FEV1FVC-%Change-Post: 6 %
FEV1FVC-%Pred-Pre: 92 %
FEV6-%Change-Post: 5 %
FEV6-%Pred-Post: 106 %
FEV6-%Pred-Pre: 101 %
FEV6-Post: 3.61 L
FEV6-Pre: 3.42 L
FEV6FVC-%Change-Post: 0 %
FEV6FVC-%Pred-Post: 101 %
FEV6FVC-%Pred-Pre: 101 %
FVC-%Change-Post: 4 %
FVC-%Pred-Post: 104 %
FVC-%Pred-Pre: 100 %
FVC-Post: 3.62 L
FVC-Pre: 3.46 L
Post FEV1/FVC ratio: 80 %
Post FEV6/FVC ratio: 100 %
Pre FEV1/FVC ratio: 76 %
Pre FEV6/FVC Ratio: 99 %
RV % pred: 132 %
RV: 2.5 L
TLC % pred: 105 %
TLC: 5.97 L

## 2020-01-19 NOTE — Progress Notes (Signed)
PFT done today. 

## 2020-02-29 DIAGNOSIS — I83893 Varicose veins of bilateral lower extremities with other complications: Secondary | ICD-10-CM | POA: Diagnosis not present

## 2020-02-29 DIAGNOSIS — I872 Venous insufficiency (chronic) (peripheral): Secondary | ICD-10-CM | POA: Diagnosis not present

## 2020-03-02 ENCOUNTER — Encounter: Payer: Self-pay | Admitting: Podiatry

## 2020-03-02 ENCOUNTER — Ambulatory Visit (INDEPENDENT_AMBULATORY_CARE_PROVIDER_SITE_OTHER): Payer: BC Managed Care – PPO

## 2020-03-02 ENCOUNTER — Other Ambulatory Visit: Payer: Self-pay

## 2020-03-02 ENCOUNTER — Ambulatory Visit (INDEPENDENT_AMBULATORY_CARE_PROVIDER_SITE_OTHER): Payer: BC Managed Care – PPO | Admitting: Podiatry

## 2020-03-02 DIAGNOSIS — R52 Pain, unspecified: Secondary | ICD-10-CM | POA: Diagnosis not present

## 2020-03-02 DIAGNOSIS — M722 Plantar fascial fibromatosis: Secondary | ICD-10-CM | POA: Diagnosis not present

## 2020-03-02 MED ORDER — DICLOFENAC SODIUM 75 MG PO TBEC: 75.0000 mg | DELAYED_RELEASE_TABLET | Freq: Two times a day (BID) | ORAL | 2 refills | Status: DC

## 2020-03-02 NOTE — Patient Instructions (Signed)
Plantar Fasciitis  Plantar fasciitis is a painful foot condition that affects the heel. It occurs when the band of tissue that connects the toes to the heel bone (plantar fascia) becomes irritated. This can happen as the result of exercising too much or doing other repetitive activities (overuse injury). The pain from plantar fasciitis can range from mild irritation to severe pain that makes it difficult to walk or move. The pain is usually worse in the morning after sleeping, or after sitting or lying down for a while. Pain may also be worse after long periods of walking or standing. What are the causes? This condition may be caused by:  Standing for long periods of time.  Wearing shoes that do not have good arch support.  Doing activities that put stress on joints (high-impact activities), including running, aerobics, and ballet.  Being overweight.  An abnormal way of walking (gait).  Tight muscles in the back of your lower leg (calf).  High arches in your feet.  Starting a new athletic activity. What are the signs or symptoms? The main symptom of this condition is heel pain. Pain may:  Be worse with first steps after a time of rest, especially in the morning after sleeping or after you have been sitting or lying down for a while.  Be worse after long periods of standing still.  Decrease after 30-45 minutes of activity, such as gentle walking. How is this diagnosed? This condition may be diagnosed based on your medical history and your symptoms. Your health care provider may ask questions about your activity level. Your health care provider will do a physical exam to check for:  A tender area on the bottom of your foot.  A high arch in your foot.  Pain when you move your foot.  Difficulty moving your foot. You may have imaging tests to confirm the diagnosis, such as:  X-rays.  Ultrasound.  MRI. How is this treated? Treatment for plantar fasciitis depends on how  severe your condition is. Treatment may include:  Rest, ice, applying pressure (compression), and raising the affected foot (elevation). This may be called RICE therapy. Your health care provider may recommend RICE therapy along with over-the-counter pain medicines to manage your pain.  Exercises to stretch your calves and your plantar fascia.  A splint that holds your foot in a stretched, upward position while you sleep (night splint).  Physical therapy to relieve symptoms and prevent problems in the future.  Injections of steroid medicine (cortisone) to relieve pain and inflammation.  Stimulating your plantar fascia with electrical impulses (extracorporeal shock wave therapy). This is usually the last treatment option before surgery.  Surgery, if other treatments have not worked after 12 months. Follow these instructions at home:  Managing pain, stiffness, and swelling  If directed, put ice on the painful area: ? Put ice in a plastic bag, or use a frozen bottle of water. ? Place a towel between your skin and the bag or bottle. ? Roll the bottom of your foot over the bag or bottle. ? Do this for 20 minutes, 2-3 times a day.  Wear athletic shoes that have air-sole or gel-sole cushions, or try wearing soft shoe inserts that are designed for plantar fasciitis.  Raise (elevate) your foot above the level of your heart while you are sitting or lying down. Activity  Avoid activities that cause pain. Ask your health care provider what activities are safe for you.  Do physical therapy exercises and stretches as told   by your health care provider.  Try activities and forms of exercise that are easier on your joints (low-impact). Examples include swimming, water aerobics, and biking. General instructions  Take over-the-counter and prescription medicines only as told by your health care provider.  Wear a night splint while sleeping, if told by your health care provider. Loosen the splint  if your toes tingle, become numb, or turn cold and blue.  Maintain a healthy weight, or work with your health care provider to lose weight as needed.  Keep all follow-up visits as told by your health care provider. This is important. Contact a health care provider if you:  Have symptoms that do not go away after caring for yourself at home.  Have pain that gets worse.  Have pain that affects your ability to move or do your daily activities. Summary  Plantar fasciitis is a painful foot condition that affects the heel. It occurs when the band of tissue that connects the toes to the heel bone (plantar fascia) becomes irritated.  The main symptom of this condition is heel pain that may be worse after exercising too much or standing still for a long time.  Treatment varies, but it usually starts with rest, ice, compression, and elevation (RICE therapy) and over-the-counter medicines to manage pain. This information is not intended to replace advice given to you by your health care provider. Make sure you discuss any questions you have with your health care provider. Document Revised: 04/25/2017 Document Reviewed: 03/10/2017 Elsevier Patient Education  2020 [employer]  

## 2020-03-06 NOTE — Progress Notes (Signed)
Subjective:   Patient ID: Kelly Bolton, female   DOB: 44 y.o.   MRN: [id number]   HPI Patient presents with painful heel right stating that its been hurting her for a number of months getting worse over that time.  Patient states she has tried different modalities without relief and does not smoke likes to be active   Review of Systems  All other systems reviewed and are negative.       Objective:  Physical Exam Vitals and nursing note reviewed.  Constitutional:      Appearance: She is well-developed.  Pulmonary:     Effort: Pulmonary effort is normal.  Musculoskeletal:        General: Normal range of motion.  Skin:    General: Skin is warm.  Neurological:     Mental Status: She is alert.     Neurovascular status intact muscle strength was found to be adequate range of motion within normal limits.  Patient is found to have exquisite discomfort plantar fascial right at the insertional point tendon calcaneus with inflammation fluid in the medial band at the insertional point tendon into the calcaneus     Assessment:  Acute plantar fasciitis right with inflammation fluid around the medial band     Plan:  H NP conditions reviewed sterile prep done and injected the fascia 3 mg Kenalog 5 mg Xylocaine.  Instructed on shoe gear modifications physical therapy  X-rays indicate small spur no indications of stress fracture arthritis

## 2020-03-15 DIAGNOSIS — U071 COVID-19: Secondary | ICD-10-CM | POA: Diagnosis not present

## 2020-03-29 ENCOUNTER — Ambulatory Visit: Payer: BC Managed Care – PPO | Admitting: Podiatry

## 2020-04-18 DIAGNOSIS — F410 Panic disorder [episodic paroxysmal anxiety] without agoraphobia: Secondary | ICD-10-CM | POA: Diagnosis not present

## 2020-04-18 DIAGNOSIS — F4312 Post-traumatic stress disorder, chronic: Secondary | ICD-10-CM | POA: Diagnosis not present

## 2020-04-18 DIAGNOSIS — F909 Attention-deficit hyperactivity disorder, unspecified type: Secondary | ICD-10-CM | POA: Diagnosis not present

## 2020-06-17 IMAGING — CT CT ABDOMEN AND PELVIS WITH CONTRAST
2 of 5 series · 16 of 46 positions shown, 18 images · IV contrast (omnipaque)
Comparison: Chest x-ray 12/06/2018

CLINICAL DATA: Abdominal distension, left lower quadrant pain for 2
days with fever, [id number] positive

EXAM:
CT ABDOMEN AND PELVIS WITH CONTRAST
TECHNIQUE: Multidetector CT imaging of the abdomen and pelvis was performed
using the standard protocol following bolus administration of
intravenous contrast.
CONTRAST:  100mL OMNIPAQUE IOHEXOL 300 MG/ML  SOLN

[Series 2: axial st · axial · 0.82mm/px · z∈[+519,+889]mm · 13 of 84 slices shown, 15 images]
[im 5/84  soft-tissue]
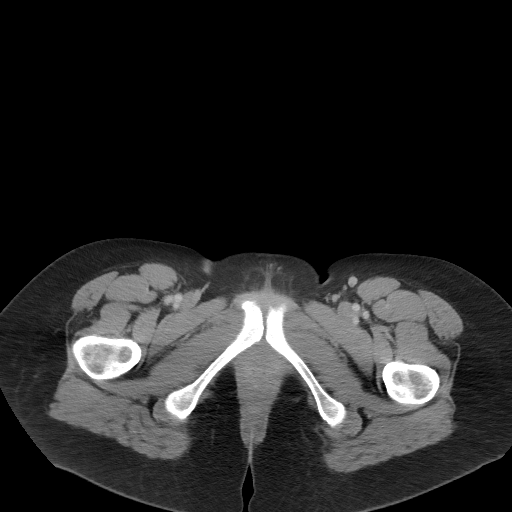
[im 5/84  bone]
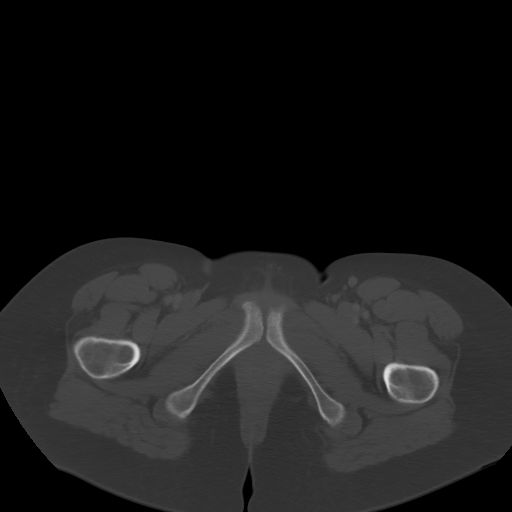
[im 13/84  soft-tissue]
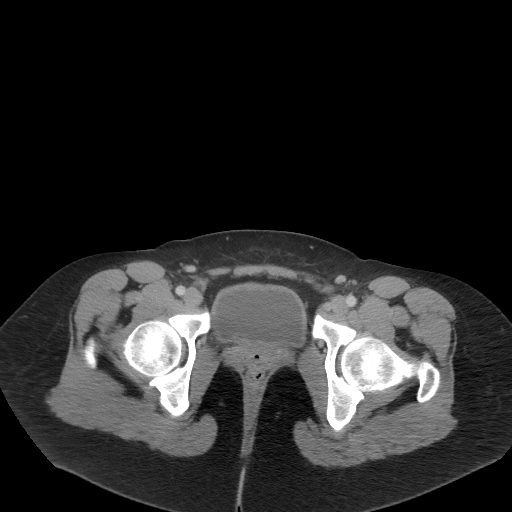
[im 17/84  soft-tissue]
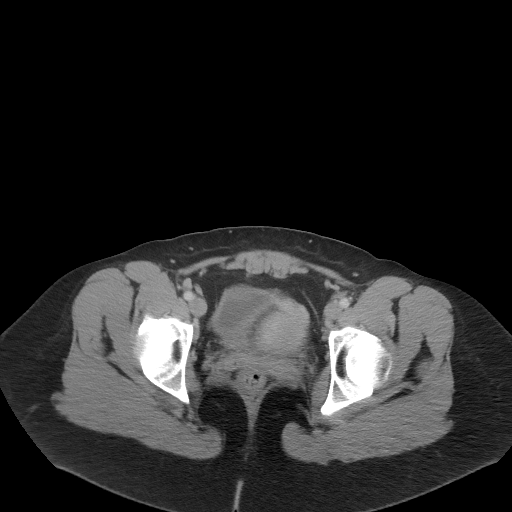
[im 25/84  soft-tissue]
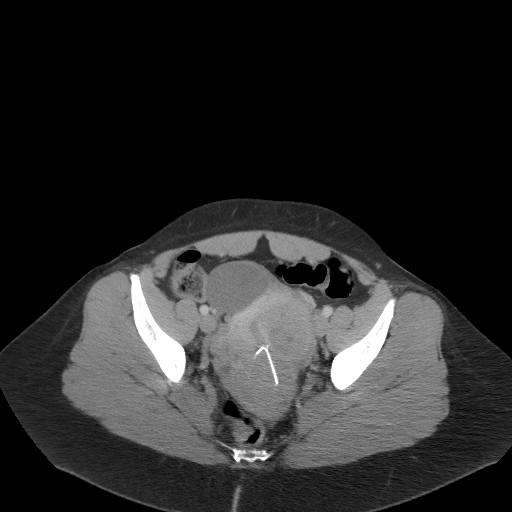
[im 30/84  soft-tissue]
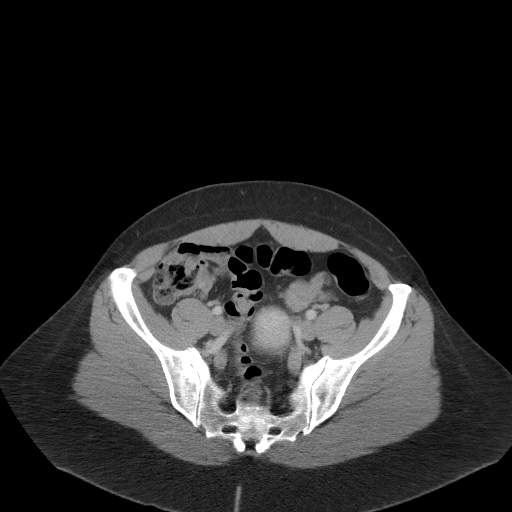
[im 38/84  soft-tissue]
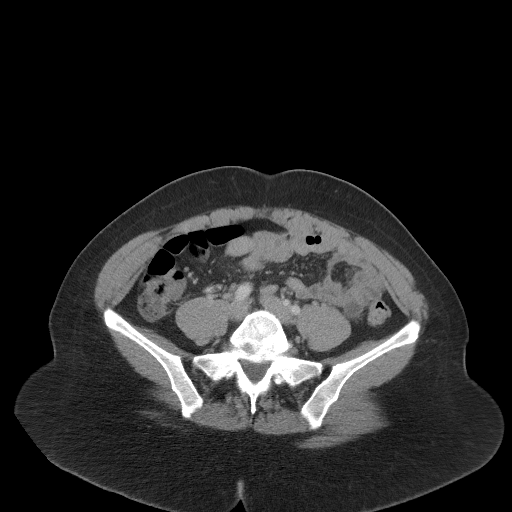
[im 42/84  soft-tissue]
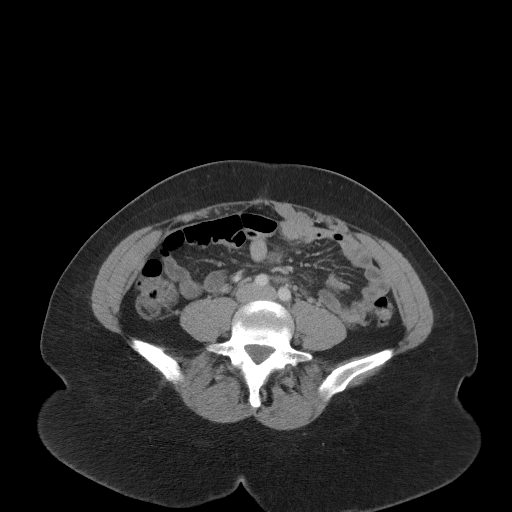
[im 46/84  soft-tissue]
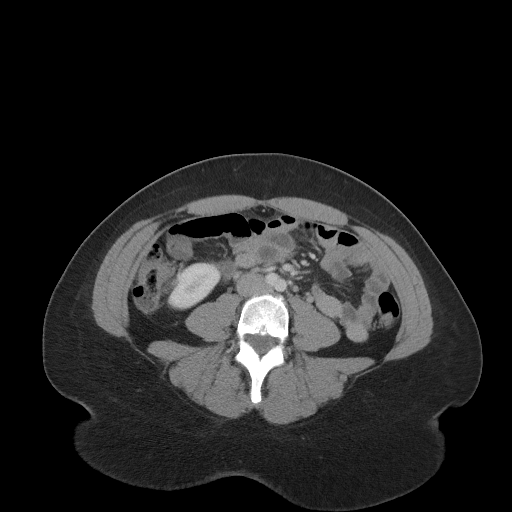
[im 54/84  soft-tissue]
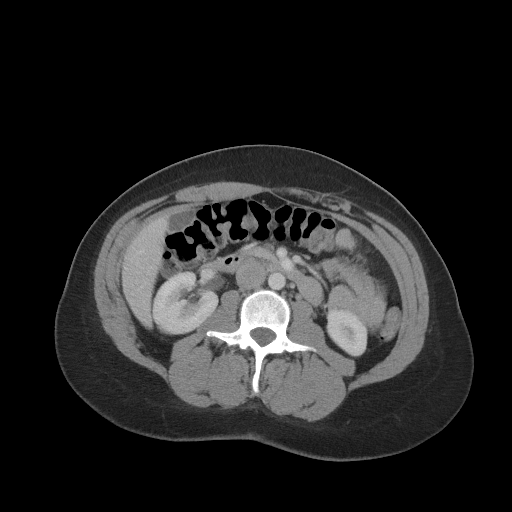
[im 54/84  bone]
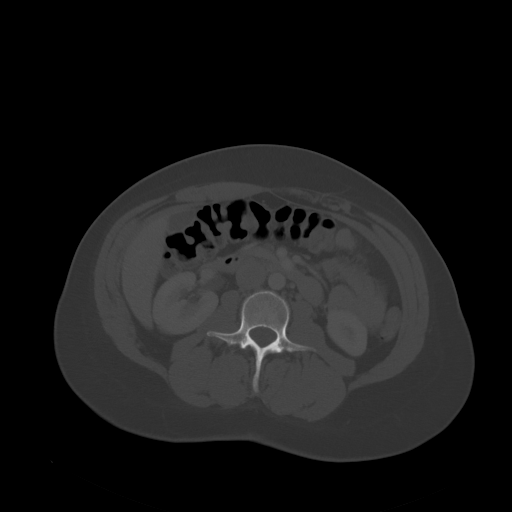
[im 59/84  soft-tissue]
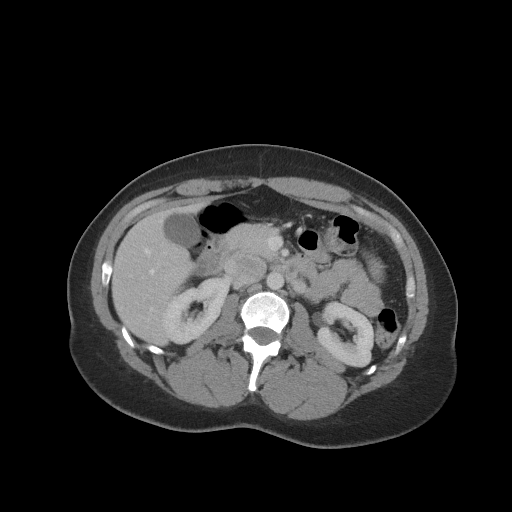
[im 67/84  soft-tissue]
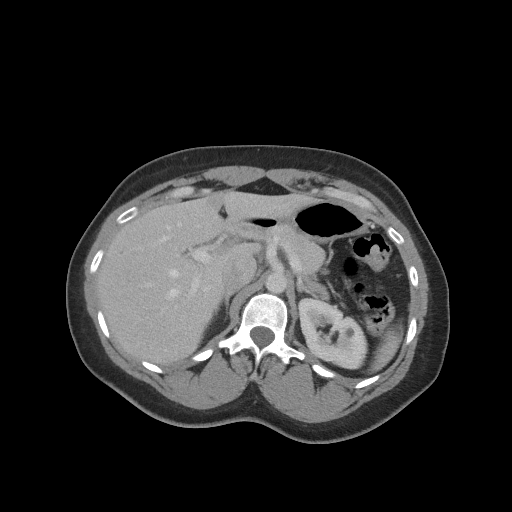
[im 71/84  soft-tissue]
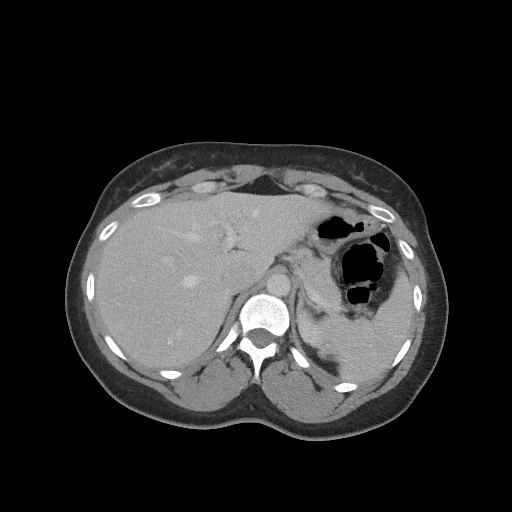
[im 79/84  soft-tissue]
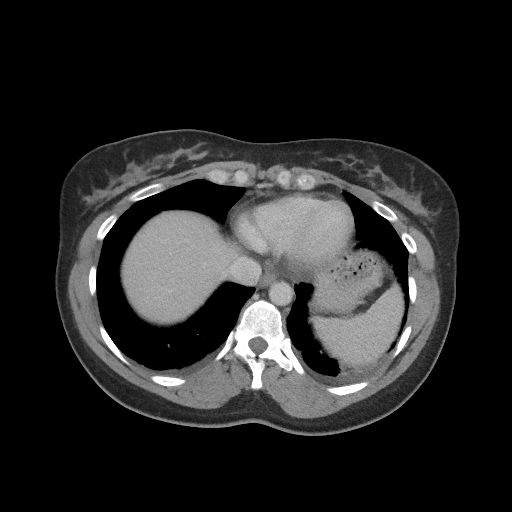

[Series 5: coronal st · coronal · 0.73mm/px · 3 of 95 slices shown]
[im 32/95  soft-tissue]
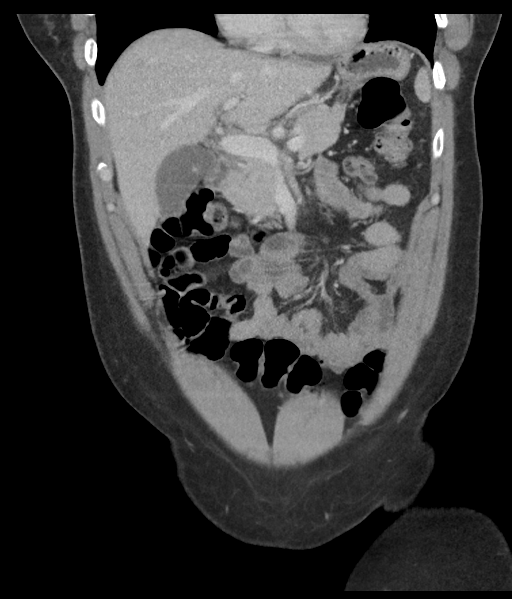
[im 42/95  soft-tissue]
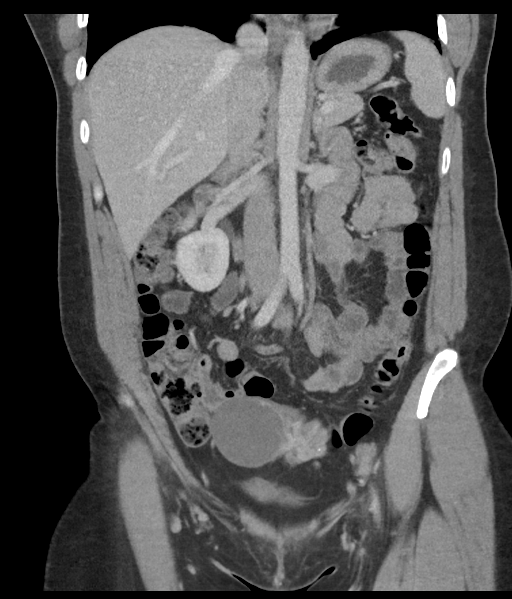
[im 53/95  soft-tissue]
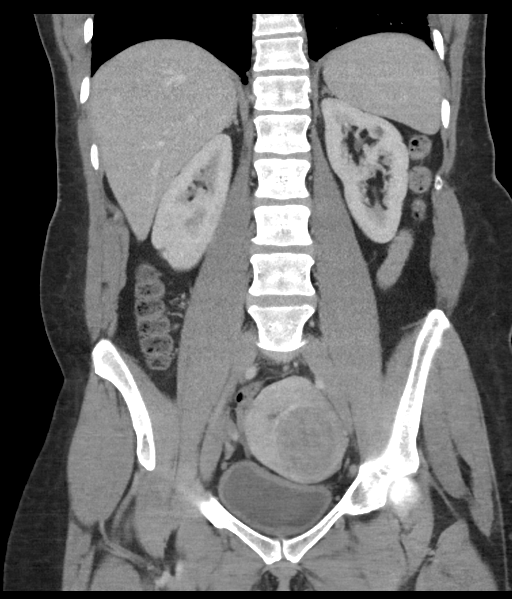

[16 of 46 positions shown; findings below may reference images not displayed]

FINDINGS: Lower chest: Lung bases demonstrate subsegmental atelectasis at the
right middle lobe. There are small pleural effusions. Normal heart
size.

Hepatobiliary: Subcentimeter hypodensity within the right hepatic
lobe, too small to further characterize. Probable focal fat
infiltration near the falciform ligament. No gallstones, gallbladder
wall thickening, or biliary dilatation.

Pancreas: Unremarkable. No pancreatic ductal dilatation or
surrounding inflammatory changes.

Spleen: Normal in size without focal abnormality.

Adrenals/Urinary Tract: Adrenal glands are normal. Subcentimeter
hypodensities left kidney too small to further characterize. The
bladder is slightly thick walled

Stomach/Bowel: Stomach is within normal limits. Appendix appears
normal. No evidence of bowel wall thickening, distention, or
inflammatory changes.

Vascular/Lymphatic: Nonaneurysmal aorta. No significantly enlarged
lymph nodes

Reproductive: Uterine enlargement. Dominant heterogenous enhancing
mass within the left uterus measuring up to 5.5 cm, consistent with
fibroid. Intrauterine device is present, it appears to be somewhat
low lying at the lower uterine segment/upper cervical region.
Circumscribed 5.3 by 3.8 cm suspected complex cyst along the right
anterior aspect of the uterus, likely right adnexal.

Other: Negative for free fluid or free air.

Musculoskeletal: No acute or significant osseous findings.
IMPRESSION: 1. Small bilateral pleural effusions.
2. Slightly thick-walled appearance of the urinary bladder, question
mild cystitis
3. Enlarged uterus with dominant 5.5 cm enhancing mass presumably a
fibroid. Intrauterine device appears low lying in the lower uterine
segment/upper cervix
4. 5.3 cm circumscribed oval hypodensity along the right anterior
aspect of the uterus, presumably representing complex cyst in the
adnexa, likely right adnexa. Correlation with pelvic ultrasound is
recommended.

## 2020-07-24 DIAGNOSIS — K219 Gastro-esophageal reflux disease without esophagitis: Secondary | ICD-10-CM | POA: Diagnosis not present

## 2020-07-24 DIAGNOSIS — K625 Hemorrhage of anus and rectum: Secondary | ICD-10-CM | POA: Diagnosis not present

## 2020-07-24 DIAGNOSIS — K5900 Constipation, unspecified: Secondary | ICD-10-CM | POA: Diagnosis not present

## 2020-07-24 DIAGNOSIS — R110 Nausea: Secondary | ICD-10-CM | POA: Diagnosis not present

## 2020-08-11 DIAGNOSIS — Z01812 Encounter for preprocedural laboratory examination: Secondary | ICD-10-CM | POA: Diagnosis not present

## 2020-08-11 DIAGNOSIS — R110 Nausea: Secondary | ICD-10-CM | POA: Diagnosis not present

## 2020-08-11 DIAGNOSIS — K219 Gastro-esophageal reflux disease without esophagitis: Secondary | ICD-10-CM | POA: Diagnosis not present

## 2020-08-11 DIAGNOSIS — Z20822 Contact with and (suspected) exposure to covid-19: Secondary | ICD-10-CM | POA: Diagnosis not present

## 2020-08-11 DIAGNOSIS — R1011 Right upper quadrant pain: Secondary | ICD-10-CM | POA: Diagnosis not present

## 2020-08-17 DIAGNOSIS — D125 Benign neoplasm of sigmoid colon: Secondary | ICD-10-CM | POA: Diagnosis not present

## 2020-08-17 DIAGNOSIS — K635 Polyp of colon: Secondary | ICD-10-CM | POA: Diagnosis not present

## 2020-08-17 DIAGNOSIS — K649 Unspecified hemorrhoids: Secondary | ICD-10-CM | POA: Diagnosis not present

## 2020-08-17 DIAGNOSIS — D123 Benign neoplasm of transverse colon: Secondary | ICD-10-CM | POA: Diagnosis not present

## 2020-08-17 DIAGNOSIS — K648 Other hemorrhoids: Secondary | ICD-10-CM | POA: Diagnosis not present

## 2020-08-17 DIAGNOSIS — D128 Benign neoplasm of rectum: Secondary | ICD-10-CM | POA: Diagnosis not present

## 2020-08-17 DIAGNOSIS — R110 Nausea: Secondary | ICD-10-CM | POA: Diagnosis not present

## 2020-08-17 DIAGNOSIS — R194 Change in bowel habit: Secondary | ICD-10-CM | POA: Diagnosis not present

## 2020-08-17 DIAGNOSIS — R1011 Right upper quadrant pain: Secondary | ICD-10-CM | POA: Diagnosis not present

## 2020-08-17 DIAGNOSIS — K625 Hemorrhage of anus and rectum: Secondary | ICD-10-CM | POA: Diagnosis not present

## 2020-08-17 DIAGNOSIS — K219 Gastro-esophageal reflux disease without esophagitis: Secondary | ICD-10-CM | POA: Diagnosis not present

## 2020-08-17 DIAGNOSIS — Z1211 Encounter for screening for malignant neoplasm of colon: Secondary | ICD-10-CM | POA: Diagnosis not present

## 2020-08-17 DIAGNOSIS — K5900 Constipation, unspecified: Secondary | ICD-10-CM | POA: Diagnosis not present

## 2020-09-28 DIAGNOSIS — J3081 Allergic rhinitis due to animal (cat) (dog) hair and dander: Secondary | ICD-10-CM | POA: Diagnosis not present

## 2020-09-28 DIAGNOSIS — J4540 Moderate persistent asthma, uncomplicated: Secondary | ICD-10-CM | POA: Diagnosis not present

## 2020-09-28 DIAGNOSIS — J3089 Other allergic rhinitis: Secondary | ICD-10-CM | POA: Diagnosis not present

## 2020-09-28 DIAGNOSIS — J301 Allergic rhinitis due to pollen: Secondary | ICD-10-CM | POA: Diagnosis not present

## 2020-10-03 DIAGNOSIS — J3081 Allergic rhinitis due to animal (cat) (dog) hair and dander: Secondary | ICD-10-CM | POA: Diagnosis not present

## 2020-10-03 DIAGNOSIS — J3089 Other allergic rhinitis: Secondary | ICD-10-CM | POA: Diagnosis not present

## 2020-10-03 DIAGNOSIS — J301 Allergic rhinitis due to pollen: Secondary | ICD-10-CM | POA: Diagnosis not present

## 2020-10-23 DIAGNOSIS — Z8739 Personal history of other diseases of the musculoskeletal system and connective tissue: Secondary | ICD-10-CM | POA: Diagnosis not present

## 2020-10-23 DIAGNOSIS — M4727 Other spondylosis with radiculopathy, lumbosacral region: Secondary | ICD-10-CM | POA: Diagnosis not present

## 2020-10-23 DIAGNOSIS — M545 Low back pain, unspecified: Secondary | ICD-10-CM | POA: Diagnosis not present

## 2020-10-23 DIAGNOSIS — Z87891 Personal history of nicotine dependence: Secondary | ICD-10-CM | POA: Diagnosis not present

## 2020-10-23 DIAGNOSIS — M5416 Radiculopathy, lumbar region: Secondary | ICD-10-CM | POA: Diagnosis not present

## 2020-10-23 DIAGNOSIS — M62830 Muscle spasm of back: Secondary | ICD-10-CM | POA: Diagnosis not present

## 2020-10-29 DIAGNOSIS — M546 Pain in thoracic spine: Secondary | ICD-10-CM | POA: Diagnosis not present

## 2020-10-29 DIAGNOSIS — M9903 Segmental and somatic dysfunction of lumbar region: Secondary | ICD-10-CM | POA: Diagnosis not present

## 2020-10-29 DIAGNOSIS — M25552 Pain in left hip: Secondary | ICD-10-CM | POA: Diagnosis not present

## 2020-10-29 DIAGNOSIS — M5417 Radiculopathy, lumbosacral region: Secondary | ICD-10-CM | POA: Diagnosis not present

## 2020-10-29 DIAGNOSIS — M9905 Segmental and somatic dysfunction of pelvic region: Secondary | ICD-10-CM | POA: Diagnosis not present

## 2020-10-29 DIAGNOSIS — M9902 Segmental and somatic dysfunction of thoracic region: Secondary | ICD-10-CM | POA: Diagnosis not present

## 2020-10-29 DIAGNOSIS — M25551 Pain in right hip: Secondary | ICD-10-CM | POA: Diagnosis not present

## 2020-10-29 DIAGNOSIS — M62830 Muscle spasm of back: Secondary | ICD-10-CM | POA: Diagnosis not present

## 2020-11-01 DIAGNOSIS — M5459 Other low back pain: Secondary | ICD-10-CM | POA: Diagnosis not present

## 2020-11-01 DIAGNOSIS — M62830 Muscle spasm of back: Secondary | ICD-10-CM | POA: Diagnosis not present

## 2020-11-01 DIAGNOSIS — M5417 Radiculopathy, lumbosacral region: Secondary | ICD-10-CM | POA: Diagnosis not present

## 2020-11-01 DIAGNOSIS — M9905 Segmental and somatic dysfunction of pelvic region: Secondary | ICD-10-CM | POA: Diagnosis not present

## 2020-11-01 DIAGNOSIS — M9902 Segmental and somatic dysfunction of thoracic region: Secondary | ICD-10-CM | POA: Diagnosis not present

## 2020-11-01 DIAGNOSIS — M25552 Pain in left hip: Secondary | ICD-10-CM | POA: Diagnosis not present

## 2020-11-01 DIAGNOSIS — M25551 Pain in right hip: Secondary | ICD-10-CM | POA: Diagnosis not present

## 2020-11-01 DIAGNOSIS — M9903 Segmental and somatic dysfunction of lumbar region: Secondary | ICD-10-CM | POA: Diagnosis not present

## 2020-11-07 DIAGNOSIS — M5441 Lumbago with sciatica, right side: Secondary | ICD-10-CM | POA: Diagnosis not present

## 2020-11-15 DIAGNOSIS — F909 Attention-deficit hyperactivity disorder, unspecified type: Secondary | ICD-10-CM | POA: Diagnosis not present

## 2020-11-15 DIAGNOSIS — F410 Panic disorder [episodic paroxysmal anxiety] without agoraphobia: Secondary | ICD-10-CM | POA: Diagnosis not present

## 2020-11-15 DIAGNOSIS — F4312 Post-traumatic stress disorder, chronic: Secondary | ICD-10-CM | POA: Diagnosis not present

## 2020-11-17 DIAGNOSIS — Z01419 Encounter for gynecological examination (general) (routine) without abnormal findings: Secondary | ICD-10-CM | POA: Diagnosis not present

## 2020-11-17 DIAGNOSIS — N921 Excessive and frequent menstruation with irregular cycle: Secondary | ICD-10-CM | POA: Diagnosis not present

## 2020-11-20 ENCOUNTER — Other Ambulatory Visit: Payer: Self-pay | Admitting: Family Medicine

## 2020-11-20 DIAGNOSIS — M5441 Lumbago with sciatica, right side: Secondary | ICD-10-CM

## 2020-11-20 DIAGNOSIS — M5416 Radiculopathy, lumbar region: Secondary | ICD-10-CM

## 2020-11-21 DIAGNOSIS — J45901 Unspecified asthma with (acute) exacerbation: Secondary | ICD-10-CM | POA: Diagnosis not present

## 2021-01-09 DIAGNOSIS — F4310 Post-traumatic stress disorder, unspecified: Secondary | ICD-10-CM | POA: Diagnosis not present

## 2021-01-09 DIAGNOSIS — F411 Generalized anxiety disorder: Secondary | ICD-10-CM | POA: Diagnosis not present

## 2021-01-09 DIAGNOSIS — F4321 Adjustment disorder with depressed mood: Secondary | ICD-10-CM | POA: Diagnosis not present

## 2021-01-09 DIAGNOSIS — F4010 Social phobia, unspecified: Secondary | ICD-10-CM | POA: Diagnosis not present

## 2021-01-30 DIAGNOSIS — N921 Excessive and frequent menstruation with irregular cycle: Secondary | ICD-10-CM | POA: Diagnosis not present

## 2021-01-30 DIAGNOSIS — F411 Generalized anxiety disorder: Secondary | ICD-10-CM | POA: Diagnosis not present

## 2021-02-02 DIAGNOSIS — F4010 Social phobia, unspecified: Secondary | ICD-10-CM | POA: Diagnosis not present

## 2021-02-02 DIAGNOSIS — F4310 Post-traumatic stress disorder, unspecified: Secondary | ICD-10-CM | POA: Diagnosis not present

## 2021-02-02 DIAGNOSIS — F411 Generalized anxiety disorder: Secondary | ICD-10-CM | POA: Diagnosis not present

## 2021-02-02 DIAGNOSIS — F4321 Adjustment disorder with depressed mood: Secondary | ICD-10-CM | POA: Diagnosis not present

## 2021-02-06 ENCOUNTER — Other Ambulatory Visit: Payer: Self-pay | Admitting: Obstetrics and Gynecology

## 2021-02-06 ENCOUNTER — Encounter (HOSPITAL_BASED_OUTPATIENT_CLINIC_OR_DEPARTMENT_OTHER): Payer: Self-pay | Admitting: Obstetrics and Gynecology

## 2021-02-06 NOTE — H&P (Signed)
Reason for Appointment  1. Preop/ Menorrhagia / Irregular cycles    History of Present Illness  Isolation Precautions:  Has patient received COVID-19 vaccination? No. Does patient report new onset of COVID symptoms? No. Has patient or close contact tested positive for COVID-19? No , not in the past 2 weeks.  General:  45 yo pt presents for a prep visit for hysteroscopy D/C with hydrothermal ablation for the management of menorrhagia with an irregular cycle.  Pt was last seen 11/17/2020 for an annual exam and reported menorrhagia with irregular cycle. At this time she reported she was previously on sprintec however menses were irregular with sprintec if she took a pill even 1 hour late. Pt reported she was interested in hydrothermal ablation.  TODAY:  Discussed with pt 3% risk of infection, bleeding, and possible perforation of the uterus, with the need for further surgery. Pt advised she will not be able to drive for 24 hours post-surgery. Pt advised she will be able to go home the same day of surgery. Pt advised watery discharge and light bleeding for about 2 weeks after the surgery is normal. Advised pt to not eat or drink after midnight before her surgery.  Pt notes she feels like her heart has been beating harder and it has been making it more difficult for her to sleep. She reports she has a history of anxiety and previously was on Buspar for management. Advised pt to start taking this medication again to help with her anxiety. Pt denies discharge with itch, burn, or odor.    Current Medications  Taking   Natazia(Estradiol Valerate-Dienogest) 3/2-2/2-3/1 MG Tablet 1 tablet Orally Once a day  Advair Diskus(Fluticasone-Salmeterol) 100-50 mcg/dose powder 2 puffs Inhalation Twice a day, Notes: prn  Albuterol 90 MCG/ACT Aerosol Solution 2 puffs inhalation as needed, Notes: prn  Methocarbamol 500 MG Tablet 1 tablet Orally 3 times a day for 14 days  Valtrex(valACYclovir HCl) 500 MG Tablet 1 tablet  Orally Once a day, Notes: prn  Not-Taking   predniSONE 20 MG Tablet 3-3-3-2-2-2-1-1-1 Orally once a day in the morning  HYDROcodone-Acetaminophen 5-325 MG Tablet 1 tablet as needed Orally every 6 hrs  traMADol HCl 50 MG Tablet 1 tablet Orally every 4 hours as needed for up to 5 days  Nabumetone 750 MG Tablet 1 tablet Orally 2 times a day for 14 days  Medication List reviewed and reconciled with the patient   Past Medical History  Acid reflux.    Surgical History  No Surgical History documented.   Family History  Father: alive  Mother: alive  1 son(s) - healthy.   denies any GYN family cancer hx.   Social History  General:  Tobacco use cigarettes: Former smoker, Quit in year 10 years ago , Tobacco history last updated 01/30/2021, Vaping No. Alcohol: yes, occasionally. Caffeine: yes, coffee, soda, tea. no Recreational drug use. Marital Status: single. Children: 1, son. OCCUPATION: employed, [employer].    Gyn History  Sexual activity currently sexually active.  Periods : irregular.  LMP 01/15/2021.  Birth control ocp.  Last pap smear date 04/06/2019-neg/HPV.  Last mammogram date 2019 - normal per pt .  Denies H/O Abnormal pap smear.    OB History  Number of pregnancies 1.  Pregnancy # 1 live birth, boy, vaginal delivery.    Allergies  N.K.D.A.   Hospitalization/Major Diagnostic Procedure  childbirth    Review of Systems  CONSTITUTIONAL:  Patient complains of trouble sleeping . Anxiety yes. Chills No. Fatigue  No. Fever No. Night sweats No. Recent travel outside [place] No. Sweats No. Weight change No.  OPHTHALMOLOGY:  Blurring of vision no. Change in vision no. Double vision no.  ENT:  Dizziness no. Nose bleeds no. Sore throat no. Teeth pain no.  ALLERGY:  Hives no.  CARDIOLOGY:  Chest pain no. High blood pressure no. Irregular heart beat no. Leg edema no. Palpitations no.  RESPIRATORY:  Shortness of breath no. Cough no. Wheezing no.  UROLOGY:  Pain with urination  no. Urinary urgency no. Urinary frequency no. Urinary incontinence no. Difficulty urinating No. Blood in urine No.  GASTROENTEROLOGY:  Abdominal pain no. Appetite change no. Bloating/belching no. Blood in stool or on toilet paper no. Change in bowel movements no. Constipation no. Diarrhea no. Difficulty swallowing no. Nausea no.  FEMALE REPRODUCTIVE:  Vulvar pain no. Vulvar rash no. Abnormal vaginal bleeding no. Breast pain no. Nipple discharge no. Pain with intercourse no. Pelvic pain no. Unusual vaginal discharge no. Vaginal itching no.  MUSCULOSKELETAL:  Muscle aches no.  NEUROLOGY:  Headache no. Tingling/numbness no. Weakness no.  PSYCHOLOGY:  Depression no. Anxiety no. Nervousness no. Sleep disturbances no. Suicidal ideation no .  ENDOCRINOLOGY:  Excessive thirst no. Excessive urination no. Hair loss no. Heat or cold intolerance no.  HEMATOLOGY/LYMPH:  Abnormal bleeding no. Easy bruising no. Swollen glands no.  DERMATOLOGY:  New/changing skin lesion no. Rash no. Sores no.  Negative except as stated in HPI.   Vital Signs  Wt 210.4, Wt change 5 lb, Ht 68, BMI 31.99, Pulse sitting 84, BP sitting 138/85.   Examination  General Examination: CONSTITUTIONAL: alert, oriented, NAD. SKIN: moist, warm. EYES: Conjunctiva clear. LUNGS: good I:E efffort noted, clear to auscultation bilaterally. HEART: heart sounds are normal, rhythm is regular, no murmur. ABDOMEN: soft, non-tender/non-distended, bowel sounds present. FEMALE GENITOURINARY: normal external genitalia, labia - unremarkable, vagina - pink moist mucosa, no lesions or abnormal discharge, cervix - no discharge or lesions or CMT, adnexa - no masses or tenderness, uterus - nontender and 12 week size . PSYCH: affect normal, good eye contact.     Physical Examination  Chaperone present:  Chaperone present [facility] 01/30/2021 10:40:29 AM > , for pelvic exam.  Pt aware of scribe services today.   Assessments     1. Menorrhagia with  irregular cycle - N92.1 (Primary)   2. 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 Menorrhagia with irregular cycle  Notes: Plan Hysteroscopy D/C with hydrothermal ablation. Discussed with pt 3% risk of infection, bleeding, and possible perforation of the uterus, with the need for further surgery. Pt advised she will not be able to drive for 24 hours post-surgery. Pt advised she will be able to go home the same day of surgery. Pt advised watery discharge and light bleeding for about 2 weeks after the surgery is normal. Pt advised to not eat or drink anything after midnight the night before her surgery.    2. Anxiety, generalized  Notes:  Pt notes she feels like her heart has been beating harder and it has been making it more difficult for her to sleep. She reports she has a history of anxiety and previously was on Buspar for management. She notes she still has this medication at home. This medication is prescribed by her PCP. Advised pt to start taking Buspar again to help with her anxiety.

## 2021-02-06 NOTE — H&P (Deleted)
  The note originally documented on this encounter has been moved the the encounter in which it belongs.  

## 2021-02-07 ENCOUNTER — Encounter (HOSPITAL_BASED_OUTPATIENT_CLINIC_OR_DEPARTMENT_OTHER): Payer: Self-pay | Admitting: Obstetrics and Gynecology

## 2021-02-13 NOTE — Progress Notes (Signed)
Sent message reminding pt to come in for lab work.

## 2021-02-14 ENCOUNTER — Ambulatory Visit (HOSPITAL_BASED_OUTPATIENT_CLINIC_OR_DEPARTMENT_OTHER): Payer: BC Managed Care – PPO | Admitting: Anesthesiology

## 2021-02-14 ENCOUNTER — Ambulatory Visit (HOSPITAL_BASED_OUTPATIENT_CLINIC_OR_DEPARTMENT_OTHER)
Admission: RE | Admit: 2021-02-14 | Discharge: 2021-02-14 | Disposition: A | Discharge status: Home / Self Care | Payer: BC Managed Care – PPO | Attending: Obstetrics and Gynecology | Admitting: Obstetrics and Gynecology

## 2021-02-14 ENCOUNTER — Other Ambulatory Visit: Payer: Self-pay

## 2021-02-14 ENCOUNTER — Encounter (HOSPITAL_BASED_OUTPATIENT_CLINIC_OR_DEPARTMENT_OTHER): Payer: Self-pay | Admitting: Obstetrics and Gynecology

## 2021-02-14 ENCOUNTER — Encounter (HOSPITAL_BASED_OUTPATIENT_CLINIC_OR_DEPARTMENT_OTHER)
Admission: RE | Disposition: A | Discharge status: Home / Self Care | Payer: Self-pay | Source: Home / Self Care | Attending: Obstetrics and Gynecology

## 2021-02-14 DIAGNOSIS — F411 Generalized anxiety disorder: Secondary | ICD-10-CM | POA: Diagnosis not present

## 2021-02-14 DIAGNOSIS — N920 Excessive and frequent menstruation with regular cycle: Secondary | ICD-10-CM | POA: Insufficient documentation

## 2021-02-14 DIAGNOSIS — Z87891 Personal history of nicotine dependence: Secondary | ICD-10-CM | POA: Diagnosis not present

## 2021-02-14 DIAGNOSIS — F419 Anxiety disorder, unspecified: Secondary | ICD-10-CM | POA: Diagnosis not present

## 2021-02-14 DIAGNOSIS — N921 Excessive and frequent menstruation with irregular cycle: Secondary | ICD-10-CM | POA: Diagnosis not present

## 2021-02-14 DIAGNOSIS — N840 Polyp of corpus uteri: Secondary | ICD-10-CM | POA: Diagnosis not present

## 2021-02-14 DIAGNOSIS — Z7951 Long term (current) use of inhaled steroids: Secondary | ICD-10-CM | POA: Diagnosis not present

## 2021-02-14 DIAGNOSIS — D259 Leiomyoma of uterus, unspecified: Secondary | ICD-10-CM | POA: Diagnosis not present

## 2021-02-14 DIAGNOSIS — G4730 Sleep apnea, unspecified: Secondary | ICD-10-CM | POA: Diagnosis not present

## 2021-02-14 DIAGNOSIS — K219 Gastro-esophageal reflux disease without esophagitis: Secondary | ICD-10-CM | POA: Diagnosis not present

## 2021-02-14 HISTORY — DX: Gastro-esophageal reflux disease without esophagitis: K21.9

## 2021-02-14 HISTORY — PX: DILITATION & CURRETTAGE/HYSTROSCOPY WITH HYDROTHERMAL ABLATION: SHX5570

## 2021-02-14 HISTORY — DX: Unspecified asthma, uncomplicated: J45.909

## 2021-02-14 HISTORY — DX: Anxiety disorder, unspecified: F41.9

## 2021-02-14 HISTORY — DX: Sleep apnea, unspecified: G47.30

## 2021-02-14 LAB — CBC
HCT: 36.1 % (ref 36.0–46.0)
Hemoglobin: 12.2 g/dL (ref 12.0–15.0)
MCH: 32.4 pg (ref 26.0–34.0)
MCHC: 33.8 g/dL (ref 30.0–36.0)
MCV: 95.8 fL (ref 80.0–100.0)
Platelets: 250 10*3/uL (ref 150–400)
RBC: 3.77 MIL/uL — ABNORMAL LOW (ref 3.87–5.11)
RDW: 13.9 % (ref 11.5–15.5)
WBC: 5.8 10*3/uL (ref 4.0–10.5)
nRBC: 0 % (ref 0.0–0.2)

## 2021-02-14 LAB — POCT PREGNANCY, URINE: Preg Test, Ur: NEGATIVE

## 2021-02-14 SURGERY — DILATATION & CURETTAGE/HYSTEROSCOPY WITH HYDROTHERMAL ABLATION
Anesthesia: General | Site: Uterus

## 2021-02-14 MED ORDER — MIDAZOLAM HCL 2 MG/2ML IJ SOLN
INTRAMUSCULAR | Status: AC
  Filled 2021-02-14: qty 2

## 2021-02-14 MED ORDER — KETOROLAC TROMETHAMINE 30 MG/ML IJ SOLN
INTRAMUSCULAR | Status: DC | PRN
  Administered 2021-02-14: 30 mg via INTRAVENOUS

## 2021-02-14 MED ORDER — LACTATED RINGERS IV SOLN: INTRAVENOUS | Status: DC

## 2021-02-14 MED ORDER — ONDANSETRON HCL 4 MG/2ML IJ SOLN
INTRAMUSCULAR | Status: DC | PRN
  Administered 2021-02-14: 4 mg via INTRAVENOUS

## 2021-02-14 MED ORDER — PROPOFOL 10 MG/ML IV BOLUS
INTRAVENOUS | Status: DC | PRN
  Administered 2021-02-14: 200 mg via INTRAVENOUS

## 2021-02-14 MED ORDER — OXYCODONE HCL 5 MG PO TABS: 5.0000 mg | ORAL_TABLET | Freq: Once | ORAL | Status: DC | PRN

## 2021-02-14 MED ORDER — ACETAMINOPHEN 160 MG/5ML PO SOLN: 325.0000 mg | ORAL | Status: DC | PRN

## 2021-02-14 MED ORDER — ACETAMINOPHEN 325 MG PO TABS: 325.0000 mg | ORAL_TABLET | ORAL | Status: DC | PRN

## 2021-02-14 MED ORDER — OXYCODONE HCL 5 MG PO TABS: 5.0000 mg | ORAL_TABLET | ORAL | 0 refills | Status: AC | PRN

## 2021-02-14 MED ORDER — DEXAMETHASONE SODIUM PHOSPHATE 4 MG/ML IJ SOLN
INTRAMUSCULAR | Status: DC | PRN
  Administered 2021-02-14: 7 mg via INTRAVENOUS

## 2021-02-14 MED ORDER — ACETAMINOPHEN 500 MG PO TABS
ORAL_TABLET | ORAL | Status: AC
  Filled 2021-02-14: qty 2

## 2021-02-14 MED ORDER — ACETAMINOPHEN 500 MG PO TABS
1000.0000 mg | ORAL_TABLET | ORAL | Status: AC
Start: 2021-02-14 — End: 2021-02-14
  Administered 2021-02-14: 1000 mg via ORAL

## 2021-02-14 MED ORDER — FENTANYL CITRATE (PF) 100 MCG/2ML IJ SOLN
INTRAMUSCULAR | Status: AC
  Filled 2021-02-14: qty 2

## 2021-02-14 MED ORDER — ONDANSETRON HCL 4 MG/2ML IJ SOLN: 4.0000 mg | Freq: Once | INTRAMUSCULAR | Status: DC | PRN

## 2021-02-14 MED ORDER — FENTANYL CITRATE (PF) 100 MCG/2ML IJ SOLN
INTRAMUSCULAR | Status: DC | PRN
  Administered 2021-02-14 (×2): 50 ug via INTRAVENOUS

## 2021-02-14 MED ORDER — MIDAZOLAM HCL 5 MG/5ML IJ SOLN
INTRAMUSCULAR | Status: DC | PRN
  Administered 2021-02-14: 2 mg via INTRAVENOUS

## 2021-02-14 MED ORDER — SILVER NITRATE-POT NITRATE 75-25 % EX MISC
CUTANEOUS | Status: DC | PRN
  Administered 2021-02-14: 2 via TOPICAL

## 2021-02-14 MED ORDER — IBUPROFEN 800 MG PO TABS: ORAL_TABLET | ORAL | 0 refills | Status: AC

## 2021-02-14 MED ORDER — LIDOCAINE 2% (20 MG/ML) 5 ML SYRINGE
INTRAMUSCULAR | Status: DC | PRN
  Administered 2021-02-14: 100 mg via INTRAVENOUS

## 2021-02-14 MED ORDER — SODIUM CHLORIDE 0.9 % IR SOLN
Status: DC | PRN
  Administered 2021-02-14: 3000 mL

## 2021-02-14 MED ORDER — BUPIVACAINE HCL (PF) 0.25 % IJ SOLN
INTRAMUSCULAR | Status: DC | PRN
  Administered 2021-02-14: 20 mL

## 2021-02-14 MED ORDER — OXYCODONE HCL 5 MG/5ML PO SOLN: 5.0000 mg | Freq: Once | ORAL | Status: DC | PRN

## 2021-02-14 MED ORDER — MEPERIDINE HCL 25 MG/ML IJ SOLN: 6.2500 mg | INTRAMUSCULAR | Status: DC | PRN

## 2021-02-14 MED ORDER — FENTANYL CITRATE (PF) 100 MCG/2ML IJ SOLN
25.0000 ug | INTRAMUSCULAR | Status: DC | PRN
  Administered 2021-02-14: 50 ug via INTRAVENOUS

## 2021-02-14 SURGICAL SUPPLY — 16 items
ABLATOR SURESOUND NOVASURE (ABLATOR) ×2 IMPLANT
DILATOR CANAL MILEX (MISCELLANEOUS) ×2 IMPLANT
ELECT REM PT RETURN 9FT ADLT (ELECTROSURGICAL)
ELECTRODE REM PT RTRN 9FT ADLT (ELECTROSURGICAL) IMPLANT
GAUZE 4X4 16PLY ~~LOC~~+RFID DBL (SPONGE) ×4 IMPLANT
GLOVE SURG ENC TEXT LTX SZ6.5 (GLOVE) ×4 IMPLANT
GLOVE SURG UNDER POLY LF SZ6.5 (GLOVE) ×2 IMPLANT
GLOVE SURG UNDER POLY LF SZ7 (GLOVE) ×2 IMPLANT
GOWN STRL REUS W/ TWL LRG LVL3 (GOWN DISPOSABLE) ×2 IMPLANT
GOWN STRL REUS W/TWL LRG LVL3 (GOWN DISPOSABLE) ×4
PACK VAGINAL MINOR WOMEN LF (CUSTOM PROCEDURE TRAY) ×2 IMPLANT
PAD OB MATERNITY 4.3X12.25 (PERSONAL CARE ITEMS) ×2 IMPLANT
SET GENESYS HTA PROCERVA (MISCELLANEOUS) IMPLANT
SLEEVE SCD COMPRESS KNEE MED (STOCKING) ×2 IMPLANT
TOWEL GREEN STERILE FF (TOWEL DISPOSABLE) ×2 IMPLANT
UNDERPAD 30X36 HEAVY ABSORB (UNDERPADS AND DIAPERS) ×2 IMPLANT

## 2021-02-14 NOTE — Discharge Instructions (Addendum)
No tylenol until 330pm, no ibuprofen until 6pm Post Anesthesia Home Care Instructions  Activity: Get plenty of rest for the remainder of the day. A responsible individual must stay with you for 24 hours following the procedure.  For the next 24 hours, DO NOT: -Drive a car -Paediatric nurse -Drink alcoholic beverages -Take any medication unless instructed by your physician -Make any legal decisions or sign important papers.  Meals: Start with liquid foods such as gelatin or soup. Progress to regular foods as tolerated. Avoid greasy, spicy, heavy foods. If nausea and/or vomiting occur, drink only clear liquids until the nausea and/or vomiting subsides. Call your physician if vomiting continues.  Special Instructions/Symptoms: Your throat may feel dry or sore from the anesthesia or the breathing tube placed in your throat during surgery. If this causes discomfort, gargle with warm salt water. The discomfort should disappear within 24 hours.  If you had a scopolamine patch placed behind your ear for the management of post- operative nausea and/or vomiting:  1. The medication in the patch is effective for 72 hours, after which it should be removed.  Wrap patch in a tissue and discard in the trash. Wash hands thoroughly with soap and water. 2. You may remove the patch earlier than 72 hours if you experience unpleasant side effects which may include dry mouth, dizziness or visual disturbances. 3. Avoid touching the patch. Wash your hands with soap and water after contact with the patch.

## 2021-02-14 NOTE — Op Note (Signed)
02/14/2021  12:17 PM  PATIENT:  Kelly Bolton  45 y.o. female  PRE-OPERATIVE DIAGNOSIS:  Menorrhagia with Irregular Cycle  POST-OPERATIVE DIAGNOSIS:  Menorrhagia with Irregular Cycle  PROCEDURE:  Hysteroscopy Dilation and Curettage Failed hydrothermal ablation. Successful Novasure Ablation.   SURGEON:  Surgeon(s) and Role:    * Christophe Louis, MD - Primary  PHYSICIAN ASSISTANT:   ASSISTANTS: none   ANESTHESIA:   general  EBL:  10 cc   BLOOD ADMINISTERED:none  DRAINS: none   LOCAL MEDICATIONS USED:  MARCAINE     SPECIMEN:  Source of Specimen:  Endometrial Curetting's   DISPOSITION OF SPECIMEN:  PATHOLOGY  COUNTS:  YES  TOURNIQUET:  * No tourniquets in log *  DICTATION: .Note written in EPIC  PLAN OF CARE: Discharge to home after PACU  PATIENT DISPOSITION:  PACU - hemodynamically stable.   Delay start of Pharmacological VTE agent (>24 hrs) due to surgical blood loss or risk of bleeding: not applicable  Findings: normal appearing external genitalia, vaginal mucosa and cervix. The uterus sounded to 12 cm. The cervical length was 5 cm. Secretory appearing endometrial lining.   Procedure. The patient was taken to the operative room #1 at [facility] were a time out was performed. She was placed in the dorsal lithotomy position and prepped and draped in the normal sterile fashion. A speculum was placed in the vaginal vault. The anterior lip of the cervix was grasped with a single tooth tenaculum. The uterus was sounded to 12 cm. The cervix was dilated to 8 mm. The hydrothermal hysteroscope was inserted. The ostia were visualized bilaterally. There was no evidence of perforation. The HTA system failed seal check 3 times and therefore the procedure was aborted.( Seal check failed even after placing a second tenaculum)     A sharp curette was used to obtained endometrial curetting's.   Novasure ablation was performed. The uterus sounded to 12 cm the cervical length was 5 cm. The Cavity  length was set at the maximum of 6.5 cm . The Novasure device was inserted and the Cavity width was 4.5. The PCO2 seal check passed. Ablation was performed at a power of 161 watts for 39 seconds.   10 cc of 25% marcaine was injected at the 4 and 8 o'clock position.  The single tooth tenaculum was removed.  Bleeding was noted from the tenaculum site. And silver nitrate was applied.   Sponge lap and needle counts were correct x 2.  The patient was awakened from anesthesia and taken to the recovery room in stable condition.

## 2021-02-14 NOTE — Transfer of Care (Signed)
Immediate Anesthesia Transfer of Care Note  Patient: Kelly Bolton  Procedure(s) Performed: DILATATION & CURETTAGE/HYSTEROSCOPY WITH HYDROTHERMAL ABLATION (Uterus)  Patient Location: PACU  Anesthesia Type:General  Level of Consciousness: drowsy and patient cooperative  Airway & Oxygen Therapy: Patient Spontanous Breathing and Patient connected to face mask oxygen  Post-op Assessment: Report given to RN and Post -op Vital signs reviewed and stable  Post vital signs: Reviewed and stable  Last Vitals:  Vitals Value Taken Time  BP    Temp    Pulse 77 02/14/21 1213  Resp    SpO2 100 % 02/14/21 1213  Vitals shown include unvalidated device data.  Last Pain:  Vitals:   02/14/21 0926  TempSrc: Oral  PainSc: 2       Patients Stated Pain Goal: 2 (00/97/94 9971)  Complications: No notable events documented.

## 2021-02-14 NOTE — Anesthesia Procedure Notes (Signed)
Procedure Name: LMA Insertion Date/Time: 02/14/2021 11:27 AM Performed by: Signe Colt, CRNA Pre-anesthesia Checklist: Patient identified, Emergency Drugs available, Suction available and Patient being monitored Patient Re-evaluated:Patient Re-evaluated prior to induction Oxygen Delivery Method: Circle System Utilized Preoxygenation: Pre-oxygenation with 100% oxygen Induction Type: IV induction Ventilation: Mask ventilation without difficulty LMA: LMA inserted LMA Size: 4.0 Number of attempts: 1 Airway Equipment and Method: bite block Placement Confirmation: positive ETCO2 Tube secured with: Tape Dental Injury: Teeth and Oropharynx as per pre-operative assessment

## 2021-02-14 NOTE — H&P (Signed)
Date of Initial H&P: 02/06/2021  History reviewed, patient examined, no change in status, stable for surgery.

## 2021-02-14 NOTE — Anesthesia Preprocedure Evaluation (Addendum)
Anesthesia Evaluation  Patient identified by MRN, date of birth, ID band Patient awake    Reviewed: Allergy & Precautions, H&P , NPO status , Patient's Chart, lab work & pertinent test results, reviewed documented beta blocker date and time   Airway Mallampati: II  TM Distance: >3 FB Neck ROM: full    Dental  (+) Teeth Intact, Dental Advisory Given FULL BRACES:   Pulmonary asthma , sleep apnea , former smoker,    Pulmonary exam normal breath sounds clear to auscultation       Cardiovascular Exercise Tolerance: Good negative cardio ROS   Rhythm:regular Rate:Normal     Neuro/Psych negative neurological ROS  negative psych ROS   GI/Hepatic Neg liver ROS, GERD  Medicated,  Endo/Other  Morbid obesity  Renal/GU negative Renal ROS  negative genitourinary   Musculoskeletal   Abdominal   Peds  Hematology negative hematology ROS (+)   Anesthesia Other Findings   Reproductive/Obstetrics negative OB ROS                            Anesthesia Physical Anesthesia Plan  ASA: 2  Anesthesia Plan: General   Post-op Pain Management:    Induction: Intravenous  PONV Risk Score and Plan: 3 and Ondansetron and Dexamethasone  Airway Management Planned: Oral ETT and LMA  Additional Equipment: None  Intra-op Plan:   Post-operative Plan: Extubation in OR  Informed Consent: I have reviewed the patients History and Physical, chart, labs and discussed the procedure including the risks, benefits and alternatives for the proposed anesthesia with the patient or authorized representative who has indicated his/her understanding and acceptance.     Dental Advisory Given  Plan Discussed with: CRNA and Anesthesiologist  Anesthesia Plan Comments: (  )        Anesthesia Quick Evaluation

## 2021-02-14 NOTE — Anesthesia Postprocedure Evaluation (Signed)
Anesthesia Post Note  Patient: Kelly Bolton  Procedure(s) Performed: DILATATION & CURETTAGE/HYSTEROSCOPY WITH HYDROTHERMAL ABLATION (Uterus)     Patient location during evaluation: PACU Anesthesia Type: General Level of consciousness: awake and alert Pain management: pain level controlled Vital Signs Assessment: post-procedure vital signs reviewed and stable Respiratory status: spontaneous breathing, nonlabored ventilation, respiratory function stable and patient connected to nasal cannula oxygen Cardiovascular status: blood pressure returned to baseline and stable Postop Assessment: no apparent nausea or vomiting Anesthetic complications: no   No notable events documented.  Last Vitals:  Vitals:   02/14/21 1245 02/14/21 1306  BP: 132/87 (!) 153/79  Pulse: 70 64  Resp: 19 18  Temp:  36.6 C  SpO2: 95% 98%    Last Pain:  Vitals:   02/14/21 1306  TempSrc: Oral  PainSc: 1                  [name]

## 2021-02-15 LAB — SURGICAL PATHOLOGY

## 2021-02-19 ENCOUNTER — Encounter (HOSPITAL_BASED_OUTPATIENT_CLINIC_OR_DEPARTMENT_OTHER): Payer: Self-pay | Admitting: Obstetrics and Gynecology

## 2021-02-28 DIAGNOSIS — N898 Other specified noninflammatory disorders of vagina: Secondary | ICD-10-CM | POA: Diagnosis not present

## 2021-03-02 ENCOUNTER — Ambulatory Visit: Payer: BC Managed Care – PPO | Admitting: Podiatry

## 2021-03-30 ENCOUNTER — Encounter: Payer: Self-pay | Admitting: Podiatry

## 2021-03-30 ENCOUNTER — Ambulatory Visit (INDEPENDENT_AMBULATORY_CARE_PROVIDER_SITE_OTHER): Payer: Managed Care, Other (non HMO) | Admitting: Podiatry

## 2021-03-30 ENCOUNTER — Other Ambulatory Visit: Payer: Self-pay

## 2021-03-30 DIAGNOSIS — B351 Tinea unguium: Secondary | ICD-10-CM

## 2021-03-30 MED ORDER — TERBINAFINE HCL 250 MG PO TABS: 250.0000 mg | ORAL_TABLET | Freq: Every day | ORAL | 0 refills | Status: AC

## 2021-03-30 NOTE — Progress Notes (Signed)
liver 

## 2021-04-04 NOTE — Progress Notes (Signed)
Subjective:   Patient ID: Kelly Bolton, female   DOB: 45 y.o.   MRN: [id number]   HPI Patient presents stating that she has had some discoloration in her nails of both feet that is been localized and states that she tries to trim the nails herself and that the antifungal we did last year she did not take but she would rather go ahead and take that at this time.  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  I wrote her the prescription again she is going to take it and will be seen back in about 6 months allowing the nails to regrow and that this may or may not be of benefit to her which I educated her on
